# Patient Record
Sex: Male | Born: 1975
Health system: Southern US, Community
[De-identification: ages and names within clinical notes are randomized; demographics above are authoritative.]

## PROBLEM LIST (undated history)

## (undated) DIAGNOSIS — L219 Seborrheic dermatitis, unspecified: Secondary | ICD-10-CM

## (undated) DIAGNOSIS — F419 Anxiety disorder, unspecified: Secondary | ICD-10-CM

## (undated) DIAGNOSIS — K8020 Calculus of gallbladder without cholecystitis without obstruction: Secondary | ICD-10-CM

## (undated) DIAGNOSIS — K602 Anal fissure, unspecified: Secondary | ICD-10-CM

## (undated) DIAGNOSIS — K649 Unspecified hemorrhoids: Secondary | ICD-10-CM

## (undated) DIAGNOSIS — E785 Hyperlipidemia, unspecified: Secondary | ICD-10-CM

## (undated) HISTORY — DX: Anxiety disorder, unspecified: F41.9

## (undated) HISTORY — DX: Seborrheic dermatitis, unspecified: L21.9

## (undated) HISTORY — DX: Unspecified hemorrhoids: K64.9

## (undated) HISTORY — DX: Calculus of gallbladder without cholecystitis without obstruction: K80.20

## (undated) HISTORY — DX: Hyperlipidemia, unspecified: E78.5

## (undated) HISTORY — DX: Anal fissure, unspecified: K60.2

## (undated) HISTORY — PX: WISDOM TOOTH EXTRACTION: SHX21

---

## 2002-06-18 HISTORY — PX: CHOLECYSTECTOMY: SHX55

## 2003-05-02 ENCOUNTER — Emergency Department (HOSPITAL_COMMUNITY): Admission: EM | Admit: 2003-05-02 | Discharge: 2003-05-02 | Payer: Self-pay | Admitting: Emergency Medicine

## 2003-07-06 ENCOUNTER — Encounter: Admission: RE | Admit: 2003-07-06 | Discharge: 2003-08-06 | Payer: Self-pay | Admitting: Neurosurgery

## 2005-01-01 ENCOUNTER — Emergency Department (HOSPITAL_COMMUNITY): Admission: EM | Admit: 2005-01-01 | Discharge: 2005-01-01 | Payer: Self-pay | Admitting: Emergency Medicine

## 2005-08-17 ENCOUNTER — Encounter: Admission: RE | Admit: 2005-08-17 | Discharge: 2005-08-17 | Payer: Self-pay | Admitting: Emergency Medicine

## 2005-09-05 ENCOUNTER — Ambulatory Visit (HOSPITAL_COMMUNITY): Admission: RE | Admit: 2005-09-05 | Discharge: 2005-09-05 | Payer: Self-pay | Admitting: General Surgery

## 2006-08-08 IMAGING — US US ABDOMEN COMPLETE
1 series · 14 of 25 positions shown · non-contrast
Comparison: none

CLINICAL DATA: Abdominal pain particularly right upper quadrant. 
ULTRASOUND ABDOMEN COMPLETE:
TECHNIQUE: Complete abdominal ultrasound examination was performed including evaluation of the liver, gallbladder, bile ducts, pancreas, kidneys, spleen, IVC, and abdominal aorta.

[Series 1: unknown · 0.23mm/px · 14 of 72 slices shown]
[im 1/72]
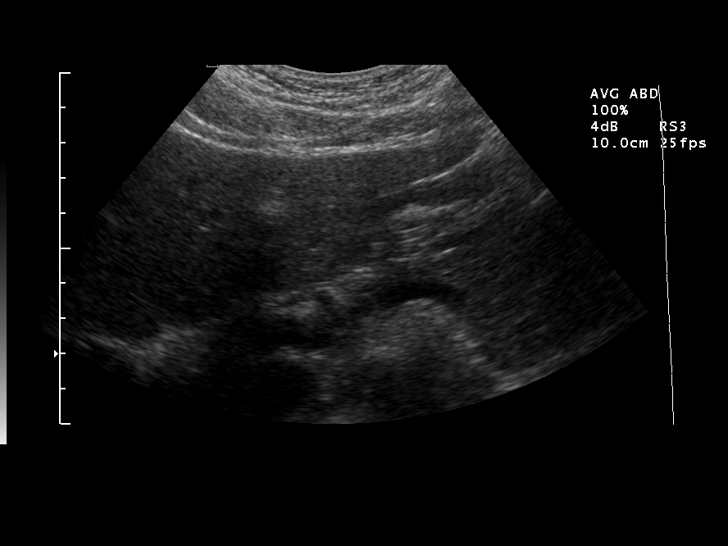
[im 6/72]
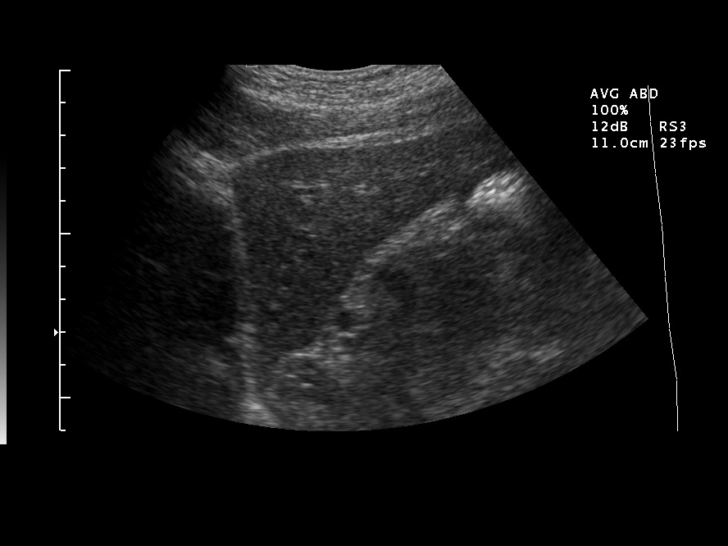
[im 12/72]
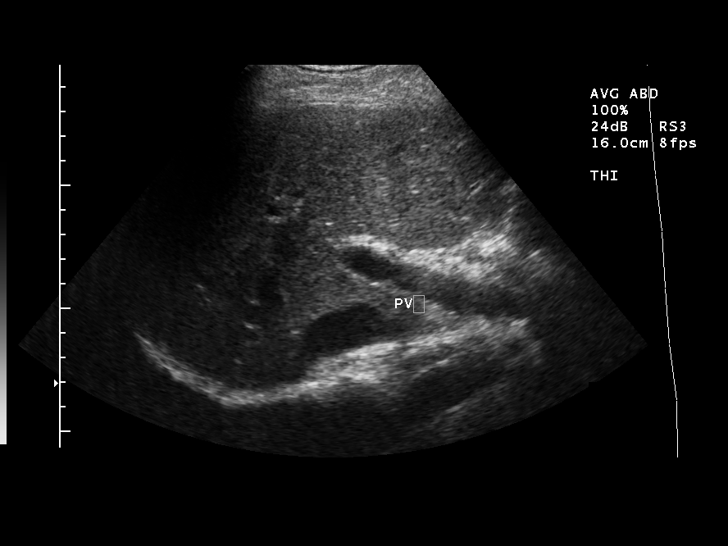
[im 18/72]
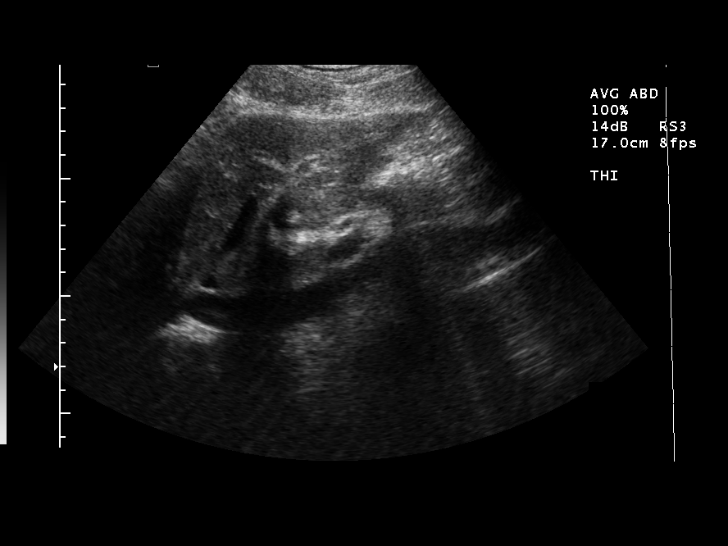
[im 24/72]
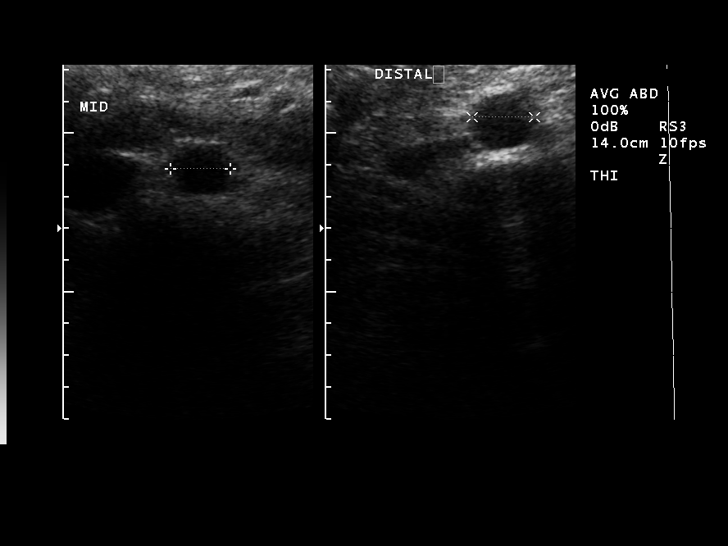
[im 27/72]
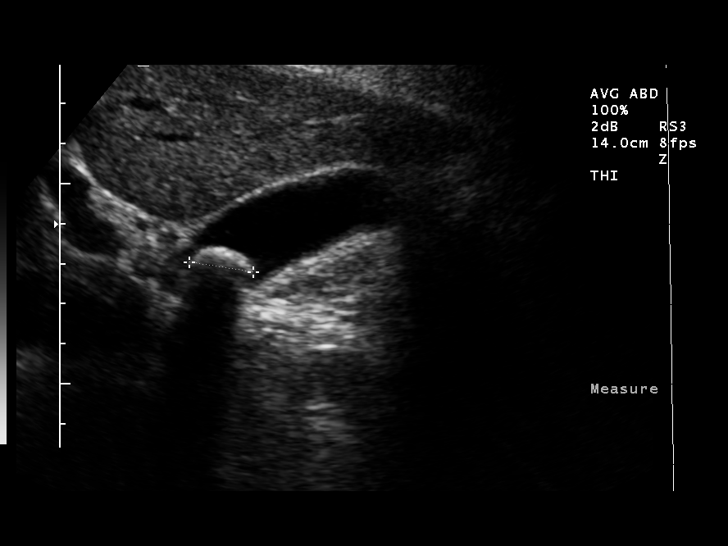
[im 33/72]
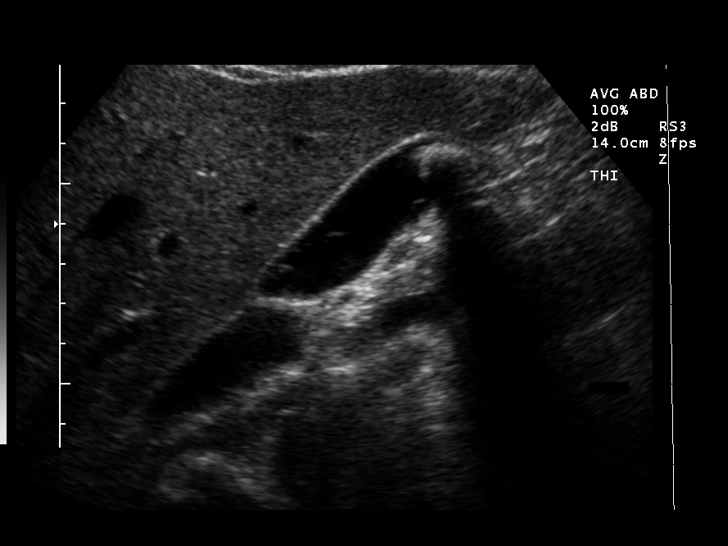
[im 39/72]
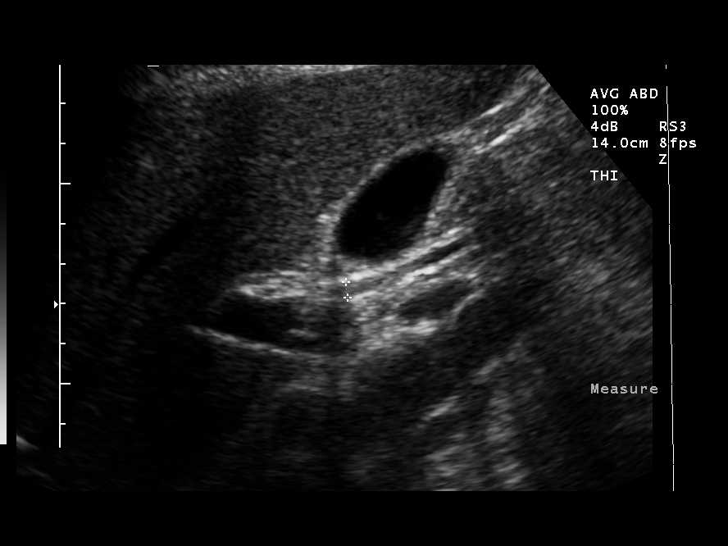
[im 45/72]
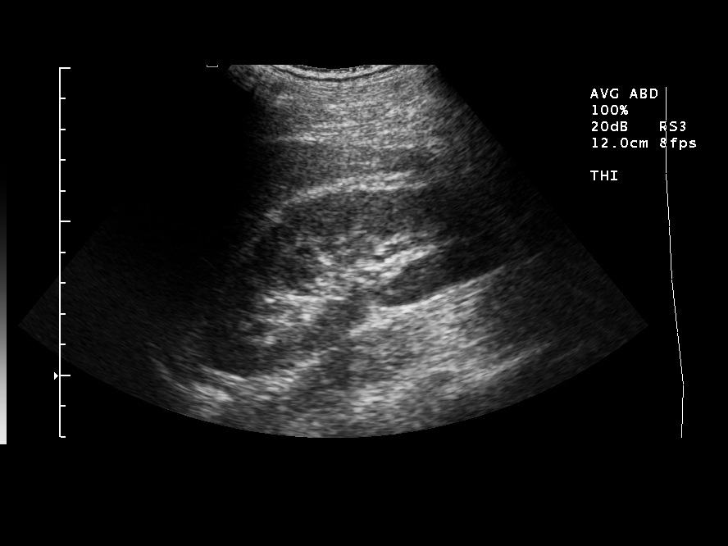
[im 48/72]
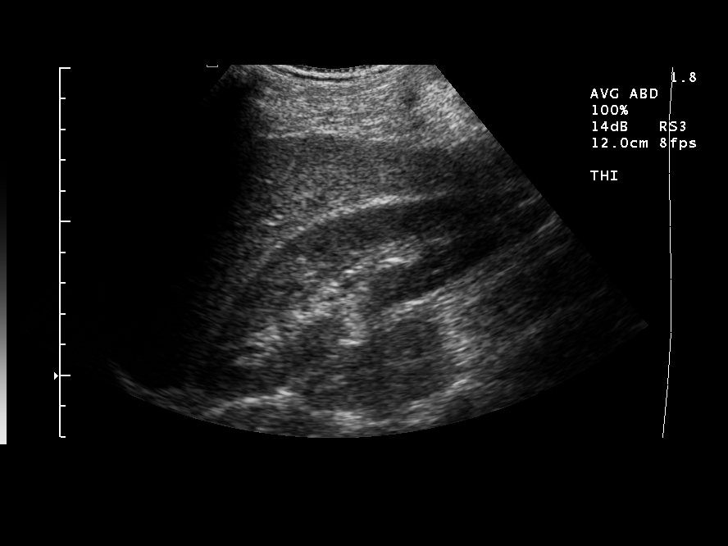
[im 54/72]
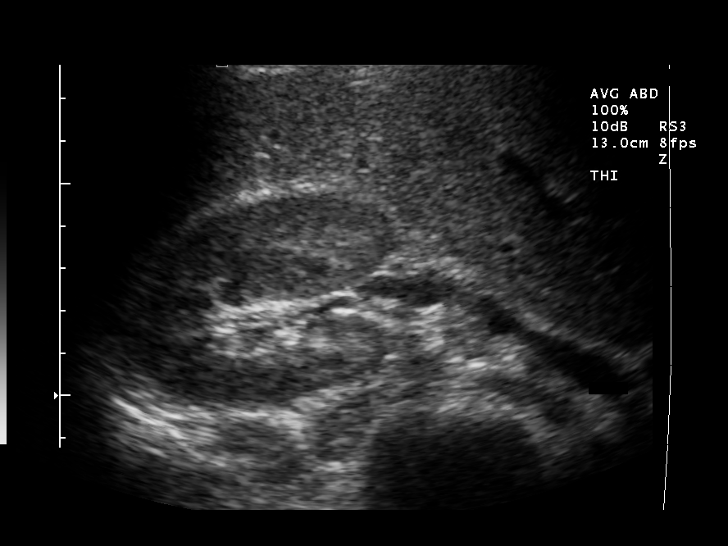
[im 60/72]
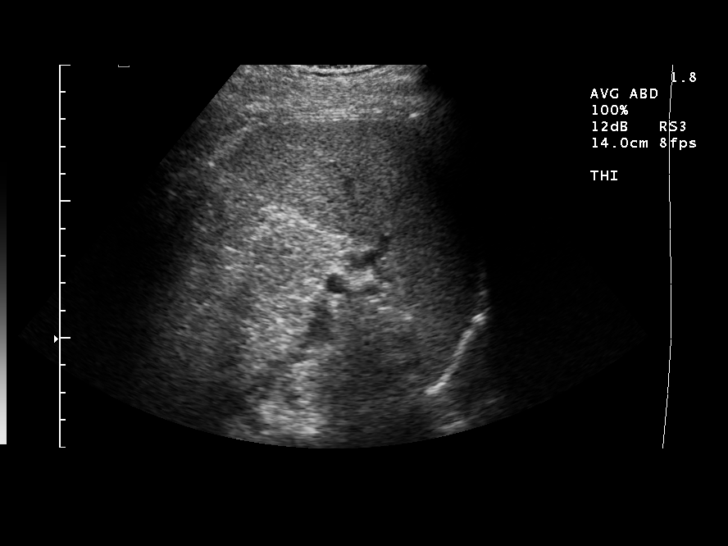
[im 66/72]
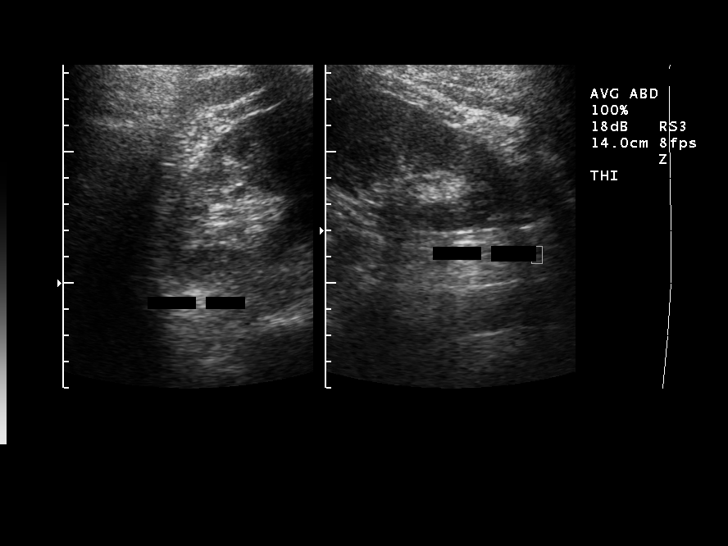
[im 72/72]
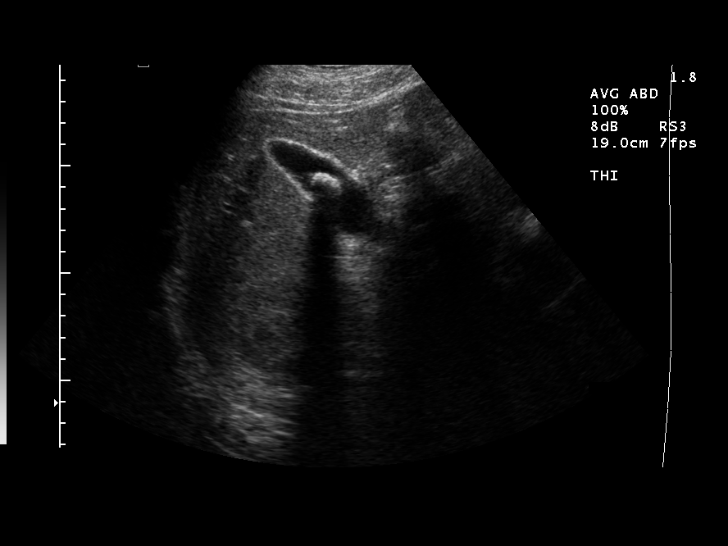

[14 of 25 positions shown; findings below may reference images not displayed]

FINDINGS: The gallbladder is well seen, and there is a mobile gallstone within the gallbladder of 1.6 cm diameter.  A small amount of sludge or tiny stone also is present.  The liver has a normal echogenic pattern.  The common bile duct is normal measuring 3.9 mm in diameter.   IVC, pancreas and spleen appear normal.  No hydronephrosis is seen.  The right kidney measures 11.3 cm sagittally with the left kidney measuring 11.9 cm.  An 8 mm cyst is noted in the lower pole of the right kidney.  The abdominal aorta is normal in caliber.
IMPRESSION: 1.6 cm mobile gallstone with probable small amount of gallbladder sludge and possible tiny gallstones.  No ductal dilatation.

## 2006-08-27 IMAGING — RF DG CHOLANGIOGRAM OPERATIVE
1 series · 4 of 4 positions shown · non-contrast
Comparison: none

CLINICAL DATA: Cholelithiasis.
 OPERATIVE CHOLANGIOGRAM:

[Series 1: run · 4 of 67 frames shown]
[frame 2/67]
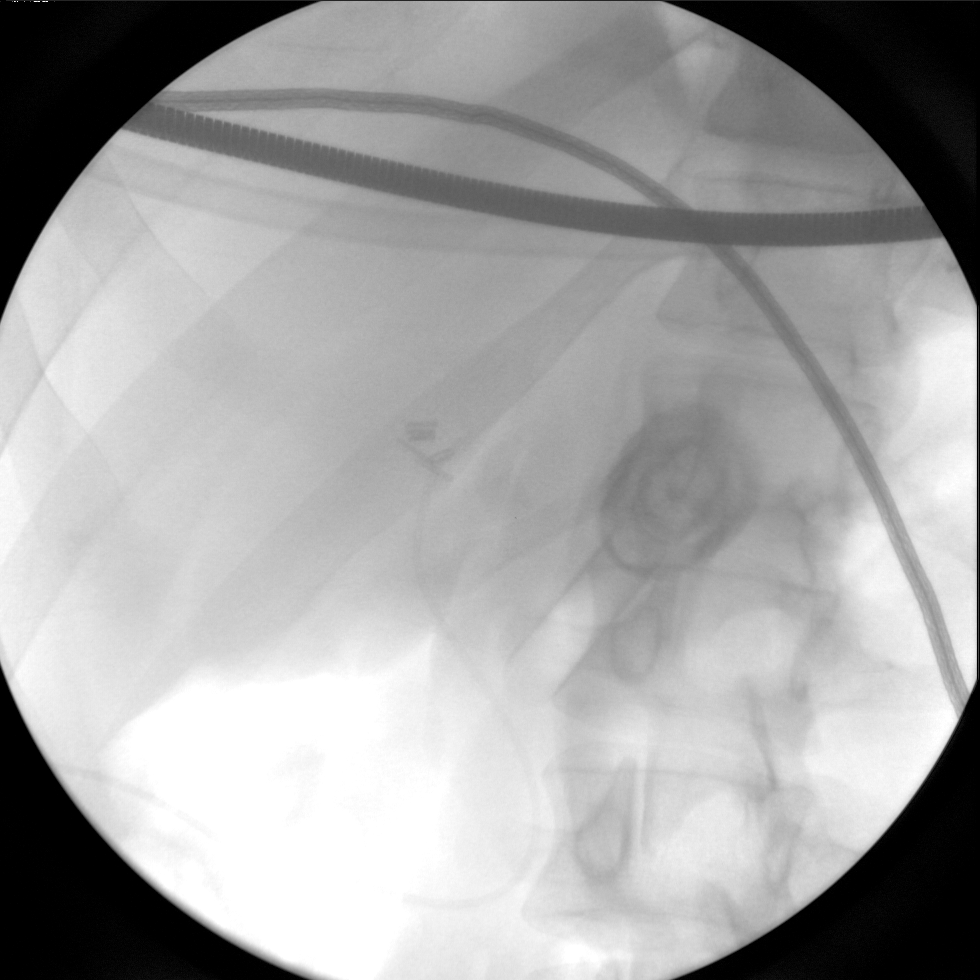
[frame 11/67]
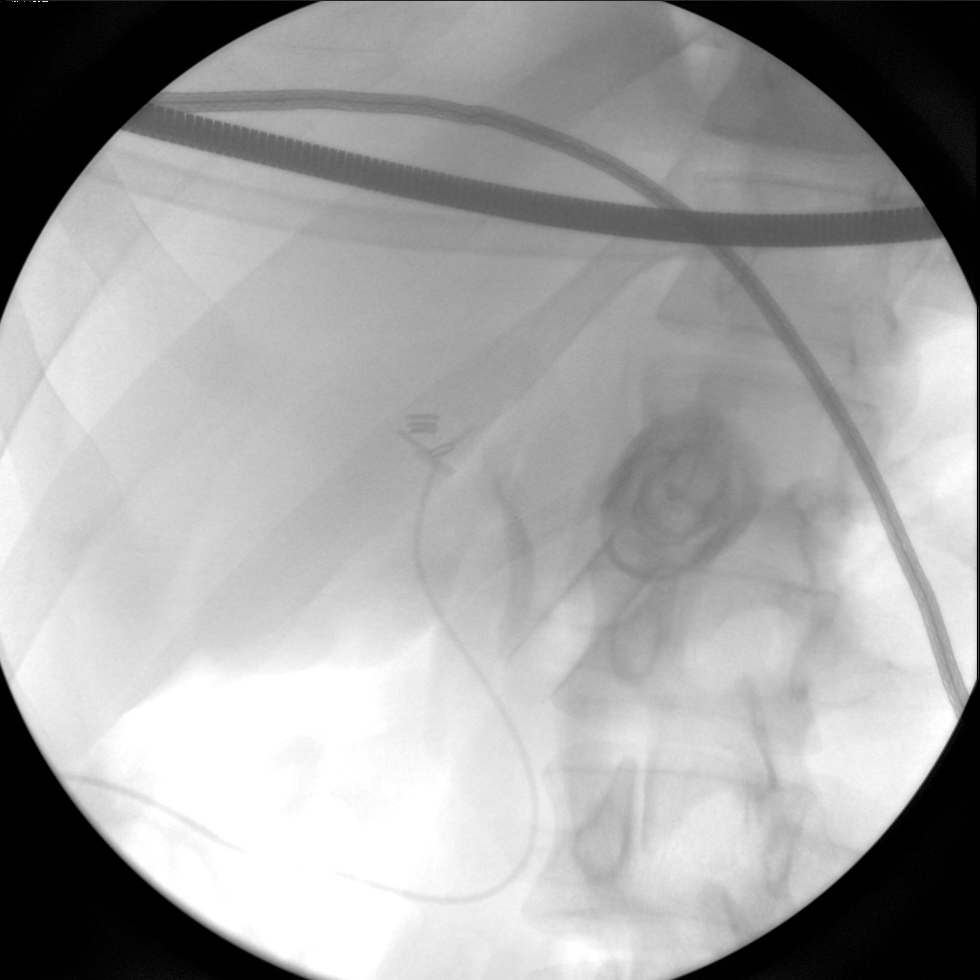
[frame 34/67]
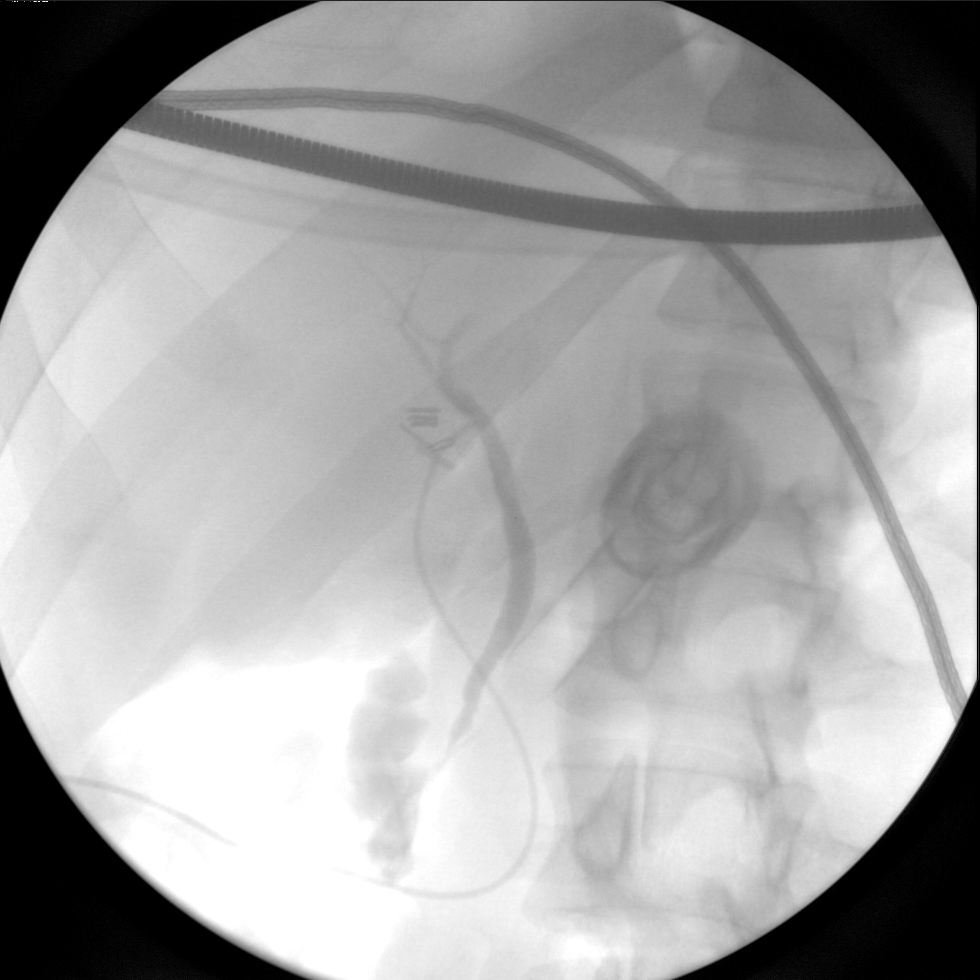
[frame 57/67]
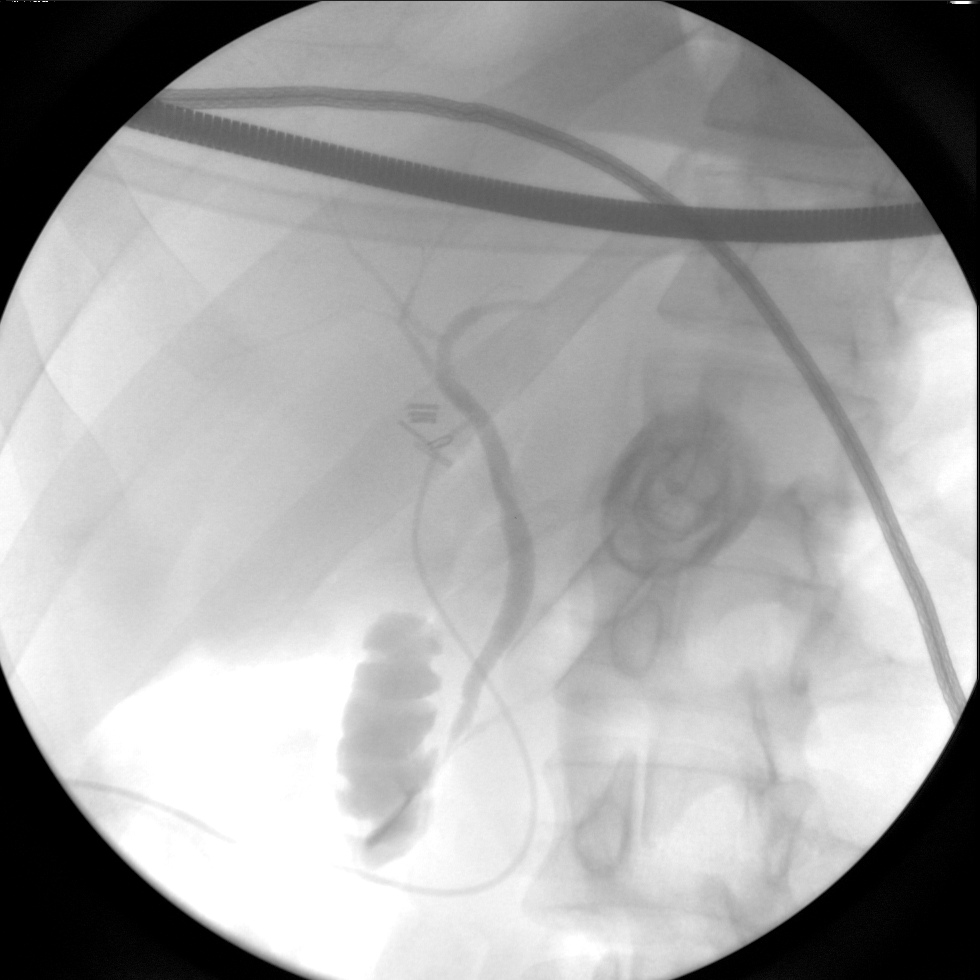

[4 of 4 positions shown; findings below may reference images not displayed]

FINDINGS: Multiple C-arm fluoroscopic exposures show contrast injection through a cannulated cystic duct remnant.  There is good filling of the common hepatic duct and common bile duct.  Contrast flows freely into the duodenum.  No filling defect or stenosis.
IMPRESSION: Normal operative cholangiogram.

## 2018-04-29 ENCOUNTER — Ambulatory Visit: Payer: Self-pay | Admitting: Family Medicine

## 2018-05-06 ENCOUNTER — Encounter: Payer: Self-pay | Admitting: Family Medicine

## 2018-05-06 ENCOUNTER — Ambulatory Visit: Payer: 59 | Admitting: Family Medicine

## 2018-05-06 VITALS — BP 130/84 | HR 66 | Temp 97.8°F | Ht 72.75 in | Wt 213.8 lb

## 2018-05-06 DIAGNOSIS — Z1159 Encounter for screening for other viral diseases: Secondary | ICD-10-CM | POA: Diagnosis not present

## 2018-05-06 DIAGNOSIS — L219 Seborrheic dermatitis, unspecified: Secondary | ICD-10-CM

## 2018-05-06 DIAGNOSIS — E785 Hyperlipidemia, unspecified: Secondary | ICD-10-CM

## 2018-05-06 DIAGNOSIS — Z13220 Encounter for screening for lipoid disorders: Secondary | ICD-10-CM

## 2018-05-06 DIAGNOSIS — Z8619 Personal history of other infectious and parasitic diseases: Secondary | ICD-10-CM | POA: Diagnosis not present

## 2018-05-06 DIAGNOSIS — M25561 Pain in right knee: Secondary | ICD-10-CM

## 2018-05-06 DIAGNOSIS — N4889 Other specified disorders of penis: Secondary | ICD-10-CM

## 2018-05-06 DIAGNOSIS — K602 Anal fissure, unspecified: Secondary | ICD-10-CM | POA: Diagnosis not present

## 2018-05-06 DIAGNOSIS — K601 Chronic anal fissure: Secondary | ICD-10-CM | POA: Insufficient documentation

## 2018-05-06 DIAGNOSIS — M25562 Pain in left knee: Secondary | ICD-10-CM

## 2018-05-06 DIAGNOSIS — Z114 Encounter for screening for human immunodeficiency virus [HIV]: Secondary | ICD-10-CM

## 2018-05-06 DIAGNOSIS — G8929 Other chronic pain: Secondary | ICD-10-CM

## 2018-05-06 LAB — COMPREHENSIVE METABOLIC PANEL
ALT: 22 U/L (ref 0–53)
AST: 20 U/L (ref 0–37)
Albumin: 4.6 g/dL (ref 3.5–5.2)
Alkaline Phosphatase: 65 U/L (ref 39–117)
BUN: 17 mg/dL (ref 6–23)
CO2: 28 mEq/L (ref 19–32)
Calcium: 9.2 mg/dL (ref 8.4–10.5)
Chloride: 103 mEq/L (ref 96–112)
Creatinine, Ser: 0.92 mg/dL (ref 0.40–1.50)
GFR: 95.98 mL/min (ref >60.00)
Glucose, Bld: 77 mg/dL (ref 70–99)
Potassium: 4.1 mEq/L (ref 3.5–5.1)
Sodium: 140 mEq/L (ref 135–145)
Total Bilirubin: 0.7 mg/dL (ref 0.2–1.2)
Total Protein: 7.1 g/dL (ref 6.0–8.3)

## 2018-05-06 LAB — LIPID PANEL
Cholesterol: 232 mg/dL — ABNORMAL HIGH (ref 0–200)
HDL: 55.1 mg/dL (ref >39.00)
LDL Cholesterol: 149 mg/dL — ABNORMAL HIGH (ref 0–99)
NonHDL: 177.36
Total CHOL/HDL Ratio: 4
Triglycerides: 142 mg/dL (ref 0.0–149.0)
VLDL: 28.4 mg/dL (ref 0.0–40.0)

## 2018-05-06 LAB — CBC
HCT: 47.8 % (ref 39.0–52.0)
Hemoglobin: 16.3 g/dL (ref 13.0–17.0)
MCHC: 34 g/dL (ref 30.0–36.0)
MCV: 86.1 fl (ref 78.0–100.0)
Platelets: 226 10*3/uL (ref 150.0–400.0)
RBC: 5.55 Mil/uL (ref 4.22–5.81)
RDW: 13 % (ref 11.5–15.5)
WBC: 4.3 10*3/uL (ref 4.0–10.5)

## 2018-05-06 MED ORDER — NITROGLYCERIN 0.4 % RE OINT: TOPICAL_OINTMENT | RECTAL | 1 refills | Status: DC

## 2018-05-06 NOTE — Assessment & Plan Note (Signed)
Continue Valtrex as needed

## 2018-05-06 NOTE — Progress Notes (Signed)
Subjective:  Connor Chapman is a 42 y.o. male who presents today with a chief complaint of anal fissure and to establish care.   HPI:  Anal Fissure Patient has had pain with defecation for several years.  This comes and goes.  He has tried several over-the-counter creams and wipes with modest improvement.  Symptoms have been stable over the last several months to years.  No obvious alleviating or aggravating factors.  No reported nausea or vomiting.  No reported abdominal pain.  No further fevers or chills.  Penile Lesion Started 2 weeks ago.  Located on instructions penis.  Nonpainful.  Symptoms seem to be improving.  No obvious precipitating events.  No other obvious alleviating or aggravating factors.  Hyperlipidemia Diet controlled.  Not currently on any medications.  Would like to have levels rechecked today.  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  Uses Valtrex as needed for outbreaks.  Seborrheic Dermatitis On ketoconazole cream as needed.  Lesions typically located on face.  Symptoms are well controlled with this.  Bilateral Knee Pain Started a few years ago.  Worse with climbing steps.  Worse with running.  Symptoms are currently manageable.   ROS: Per HPI, otherwise a complete review of systems was negative.   PMH:  The following were reviewed and entered/updated in epic: Past Medical History:  Diagnosis Date  . Anal fissure   . Dermatitis, seborrheic   . Hemorrhoids   . Hyperlipidemia    Patient Active Problem List   Diagnosis Date Noted  . Anal fissure 05/06/2018  . Penile cyst 05/06/2018  . H/O cold sores 05/06/2018  . Seborrheic dermatitis 05/06/2018  . Dyslipidemia 05/06/2018  . Chronic pain of both knees 05/06/2018   Past Surgical History:  Procedure Laterality Date  . CHOLECYSTECTOMY  2004    Family History  Problem Relation Age of Onset  . Cancer Mother        Tumor in knee  . Pancreatic cancer Paternal Grandfather   .  Hypertension Paternal Grandfather   . Cancer Paternal Grandmother     Medications- reviewed and updated Current Outpatient Medications  Medication Sig Dispense Refill  . ketoconazole (NIZORAL) 2 % cream Apply 1 application topically daily.    . valACYclovir (VALTREX) 500 MG tablet Take 500 mg by mouth 2 (two) times daily.    . Nitroglycerin 0.4 % OINT Apply twice daily for 3-4 weeks. 30 g 1   No current facility-administered medications for this visit.     Allergies-reviewed and updated Allergies  Allergen Reactions  . Penicillins Swelling    Social History   Socioeconomic History  . Marital status: Married    Spouse name: Not on file  . Number of children: Not on file  . Years of education: Not on file  . Highest education level: Not on file  Occupational History  . Not on file  Social Needs  . Financial resource strain: Not on file  . Food insecurity:    Worry: Not on file    Inability: Not on file  . Transportation needs:    Medical: Not on file    Non-medical: Not on file  Tobacco Use  . Smoking status: Former Research scientist (life sciences)  . Smokeless tobacco: Never Used  . Tobacco comment: Quit in 2002  Substance and Sexual Activity  . Alcohol use: Yes  . Drug use: Never  . Sexual activity: Yes  Lifestyle  . Physical activity:    Days per week: Not on  file    Minutes per session: Not on file  . Stress: Not on file  Relationships  . Social connections:    Talks on phone: Not on file    Gets together: Not on file    Attends religious service: Not on file    Active member of club or organization: Not on file    Attends meetings of clubs or organizations: Not on file    Relationship status: Not on file  Other Topics Concern  . Not on file  Social History Narrative  . Not on file    Objective:  Physical Exam: BP 130/84 (BP Location: Left Arm, Patient Position: Sitting, Cuff Size: Normal)   Pulse 66   Temp 97.8 F (36.6 C) (Oral)   Ht 6' 0.75" (1.848 m)   Wt 213 lb  12.8 oz (97 kg)   SpO2 99%   BMI 28.40 kg/m   Gen: NAD, resting comfortably CV: RRR with no murmurs appreciated Pulm: NWOB, CTAB with no crackles, wheezes, or rhonchi GI: Normal bowel sounds present. Soft, Nontender, Nondistended. GU: Normal external genitalia.  1 to 2 mm cystic lesion noted on shaft of penis, consistent with epidermal cyst.  Anterior midline fissure noted on perianal area. MSK: No edema, cyanosis, or clubbing noted Skin: Warm, dry Neuro: Grossly normal, moves all extremities Psych: Normal affect and thought content  Assessment/Plan:  Seborrheic dermatitis Stable.  Continue topical ketoconazole as needed.  Penile cyst Consistent with benign epidermal cyst.  Reassured patient.  Continue with watchful waiting.  H/O cold sores Continue Valtrex as needed.  Dyslipidemia Check lipid panel today.  Chronic pain of both knees Likely secondary to osteoarthritis.  Recommended conservative measures including over-the-counter analgesics, ice, and compression.  Anal fissure No red flags.  Start topical nitroglycerin ointment twice daily for the next 3 to 4 weeks.  Recommended fiber supplement, stool softener, and good oral hydration.  Consider surgical referral if no improvement.  Preventive healthcare Check hep C and HIV antibody.  [name]. Jerline Pain, MD 05/06/2018 9:33 AM

## 2018-05-06 NOTE — Assessment & Plan Note (Signed)
Consistent with benign epidermal cyst.  Reassured patient.  Continue with watchful waiting.

## 2018-05-06 NOTE — Patient Instructions (Signed)
It was very nice to see you today!  Use the nitroglycerin ointment twice daily for the next 3 to 4 weeks.  Please let me know if your symptoms worsen or do not improve with this.  Ensure you are getting plenty of fiber and water in your diet.  We will check blood work today.  Please come back to see me in 1 year, or sooner as needed.  Take care, Dr Debroah Loop Fissure, Adult An anal fissure is a small tear or crack in the skin around the anus. Bleeding from a fissure usually stops on its own within a few minutes. However, bleeding will often occur again with each bowel movement until the crack heals. What are the causes? This condition may be caused by:  Passing large, hard stool (feces).  Frequent diarrhea.  Constipation.  Inflammatory bowel disease (Crohn disease or ulcerative colitis).  Infections.  Anal sex.  What are the signs or symptoms? Symptoms of this condition include:  Bleeding from the rectum.  Small amounts of blood seen on your stool, on toilet paper, or in the toilet after a bowel movement.  Painful bowel movements.  Itching or irritation around the anus.  How is this diagnosed? A health care provider may diagnose this condition by closely examining the anal area. An anal fissure can usually be seen with careful inspection. In some cases, a rectal exam may be performed, or a short tube (anoscope) may be used to examine the anal canal. How is this treated? Treatment for this condition may include:  Taking steps to avoid constipation. This may include making changes to your diet, such as increasing your intake of fiber or fluid.  Taking fiber supplements. These supplements can soften your stool to help make bowel movements easier. Your health care provider may also prescribe a stool softener if your stool is often hard.  Taking sitz baths. This may help to heal the tear.  Using medicated creams or ointments. These may be prescribed to lessen  discomfort.  Follow these instructions at home: Eating and drinking  Avoid foods that may be constipating, such as bananas and dairy products.  Drink enough fluid to keep your urine clear or pale yellow.  Maintain a diet that is high in fruits, whole grains, and vegetables. General instructions  Keep the anal area as clean and dry as possible.  Take sitz baths as told by your health care provider. Do not use soap in the sitz baths.  Take over-the-counter and prescription medicines only as told by your health care provider.  Use creams or ointments only as told by your health care provider.  Keep all follow-up visits as told by your health care provider. This is important. Contact a health care provider if:  You have more bleeding.  You have a fever.  You have diarrhea that is mixed with blood.  You continue to have pain.  Your problem is getting worse rather than better. This information is not intended to replace advice given to you by your health care provider. Make sure you discuss any questions you have with your health care provider. Document Released: 06/04/2005 Document Revised: 10/12/2015 Document Reviewed: 08/30/2014 Elsevier Interactive Patient Education  [employer].

## 2018-05-06 NOTE — Assessment & Plan Note (Signed)
Likely secondary to osteoarthritis.  Recommended conservative measures including over-the-counter analgesics, ice, and compression.

## 2018-05-06 NOTE — Assessment & Plan Note (Signed)
No red flags.  Start topical nitroglycerin ointment twice daily for the next 3 to 4 weeks.  Recommended fiber supplement, stool softener, and good oral hydration.  Consider surgical referral if no improvement.

## 2018-05-06 NOTE — Assessment & Plan Note (Signed)
Check lipid panel today 

## 2018-05-06 NOTE — Assessment & Plan Note (Signed)
Stable.  Continue topical ketoconazole as needed.

## 2018-05-07 LAB — HIV ANTIBODY (ROUTINE TESTING W REFLEX): HIV 1&2 Ab, 4th Generation: NONREACTIVE

## 2018-05-07 LAB — HEPATITIS C ANTIBODY
Hepatitis C Ab: NONREACTIVE
SIGNAL TO CUT-OFF: 0.01 (ref <1.00)

## 2018-05-08 NOTE — Progress Notes (Signed)
Please inform patient of the following:  His cholesterol was slightly elevated, but the rest of his blood work was all normal. Do not need to start medications at this point, but he should continue working on a healthy diet and regular exercise and we can recheck next year.  Connor Chapman. Jerline Pain, MD 05/08/2018 12:45 PM

## 2019-02-11 ENCOUNTER — Encounter: Payer: Self-pay | Admitting: Family Medicine

## 2019-02-11 ENCOUNTER — Ambulatory Visit (INDEPENDENT_AMBULATORY_CARE_PROVIDER_SITE_OTHER): Payer: 59 | Admitting: Family Medicine

## 2019-02-11 DIAGNOSIS — J329 Chronic sinusitis, unspecified: Secondary | ICD-10-CM | POA: Diagnosis not present

## 2019-02-11 DIAGNOSIS — H9201 Otalgia, right ear: Secondary | ICD-10-CM | POA: Diagnosis not present

## 2019-02-11 MED ORDER — PREDNISONE 50 MG PO TABS: ORAL_TABLET | ORAL | 0 refills | Status: DC

## 2019-02-11 MED ORDER — DOXYCYCLINE HYCLATE 100 MG PO TABS: 100.0000 mg | ORAL_TABLET | Freq: Two times a day (BID) | ORAL | 0 refills | Status: DC

## 2019-02-11 MED ORDER — AZELASTINE HCL 0.1 % NA SOLN: 2.0000 | Freq: Two times a day (BID) | NASAL | 12 refills | Status: DC

## 2019-02-11 NOTE — Progress Notes (Signed)
    Chief Complaint:  Connor Chapman is a 43 y.o. male who presents today for a virtual office visit with a chief complaint of ear Chapman.   Assessment/Plan:  Ear Chapman / Sinusitis Discussed limitations of virtual evaluation and that I cannot diagnose ear infections via the phone.  Likely eustachian tube dysfunction secondary to allergic rhinitis/sinusitis.  Will start Astelin nasal spray and prednisone.  Also send in a "pocket prescription" for doxycycline with strict instruction not start unless symptoms worsen or do not improve over the next few days.  Discussed reasons to return to care.  Follow-up as needed.    Subjective:  HPI:  Ear Chapman Started about 3 days ago. Located in right ear. Associated with some sinus pressure as well.  Some nasal drainage.  No fever chills.  Tried taking over-the-counter sinus congestion medication with modest improvement.  No other treatments tried.  No known sick contacts.  No other obvious alleviating or aggravating factors.  ROS: Per HPI  PMH: He reports that he has quit smoking. He has never used smokeless tobacco. He reports current alcohol use. He reports that he does not use drugs.      Objective/Observations  Physical Exam: Gen: NAD, resting comfortably Pulm: Normal work of breathing Neuro: Grossly normal, moves all extremities Psych: Normal affect and thought content  Virtual Visit via Video   I connected with Connor Chapman on 02/11/19 at 11:20 AM EDT by a video enabled telemedicine application and verified that I am speaking with the correct person using two identifiers. I discussed the limitations of evaluation and management by telemedicine and the availability of in person appointments. The patient expressed understanding and agreed to proceed.   Patient location: Home Provider location: [place] participating in the virtual visit: Myself and Patient     Connor Chapman. Connor Pain, MD 02/11/2019 10:22 AM

## 2019-03-04 ENCOUNTER — Other Ambulatory Visit: Payer: Self-pay

## 2019-03-04 ENCOUNTER — Encounter: Payer: Self-pay | Admitting: Physician Assistant

## 2019-03-04 ENCOUNTER — Ambulatory Visit: Payer: 59 | Admitting: Physician Assistant

## 2019-03-04 VITALS — BP 110/70 | HR 67 | Temp 98.1°F | Ht 72.75 in | Wt 216.4 lb

## 2019-03-04 DIAGNOSIS — H6690 Otitis media, unspecified, unspecified ear: Secondary | ICD-10-CM

## 2019-03-04 MED ORDER — FLUTICASONE PROPIONATE 50 MCG/ACT NA SUSP
2.0000 | Freq: Every day | NASAL | 2 refills | Status: DC
Start: 2019-03-04 — End: 2019-09-29

## 2019-03-04 MED ORDER — AZITHROMYCIN 250 MG PO TABS: ORAL_TABLET | ORAL | 0 refills | Status: DC

## 2019-03-04 NOTE — Patient Instructions (Addendum)
It was great to see you!  Start oral azithromycin.  Please start flonase.  May use ibuprofen for swelling/inflammation.  Try to always clean anything that you put in your ear (such as headphones)  Push fluids and get plenty of rest. Please return if you are not improving as expected, or if you have high fevers (>101.5) or difficulty swallowing or worsening productive cough.  Call clinic with questions.  I hope you start feeling better soon!

## 2019-03-04 NOTE — Progress Notes (Signed)
Connor Chapman is a 43 y.o. male here for a new problem.  I acted as a Education administrator for [employer], PA-C Anselmo Pickler, LPN  History of Present Illness:   Chief Complaint  Patient presents with  . Otalgia    HPI   Otalgia Pt c/o Rt ear pain x 3 weeks. Pt had virtual visit on 8/26 was prescribed antibiotic Doxy, Astelin Nasal spray and Prednisone. Pt said pain went away but has not cleared up. Tingling around ear and jaw line. He is having yellow drainage from ear off and on. Denies fever, chills, nasal discharge, cough.  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    Past Medical History:  Diagnosis Date  . Anal fissure   . Dermatitis, seborrheic   . Hemorrhoids   . Hyperlipidemia      Social History   Socioeconomic History  . Marital status: Married    Spouse name: Not on file  . Number of children: Not on file  . Years of education: Not on file  . Highest education level: Not on file  Occupational History  . Not on file  Social Needs  . Financial resource strain: Not on file  . Food insecurity    Worry: Not on file    Inability: Not on file  . Transportation needs    Medical: Not on file    Non-medical: Not on file  Tobacco Use  . Smoking status: Former Research scientist (life sciences)  . Smokeless tobacco: Never Used  . Tobacco comment: Quit in 2002  Substance and Sexual Activity  . Alcohol use: Yes  . Drug use: Never  . Sexual activity: Yes  Lifestyle  . Physical activity    Days per week: Not on file    Minutes per session: Not on file  . Stress: Not on file  Relationships  . Social Herbalist on phone: Not on file    Gets together: Not on file    Attends religious service: Not on file    Active member of club or organization: Not on file    Attends meetings of clubs or organizations: Not on file    Relationship status: Not on file  . Intimate partner violence    Fear of current or ex partner: Not on file     Emotionally abused: Not on file    Physically abused: Not on file    Forced sexual activity: Not on file  Other Topics Concern  . Not on file  Social History Narrative  . Not on file    Past Surgical History:  Procedure Laterality Date  . CHOLECYSTECTOMY  2004    Family History  Problem Relation Age of Onset  . Cancer Mother        Tumor in knee  . Pancreatic cancer Paternal Grandfather   . Hypertension Paternal Grandfather   . Cancer Paternal Grandmother     Allergies  Allergen Reactions  . Penicillins Swelling    Current Medications:   Current Outpatient Medications:  .  azelastine (ASTELIN) 0.1 % nasal spray, Place 2 sprays into both nostrils 2 (two) times daily., Disp: 30 mL, Rfl: 12 .  valACYclovir (VALTREX) 500 MG tablet, Take 500 mg by mouth as needed. , Disp: , Rfl:  .  azithromycin (ZITHROMAX Z-PAK) 250 MG tablet, Take 2 tablets ( total of 500 mg) PO on day 1, then 1 tablet ( total of 250 mg)  PO q24 x 4 days., Disp: 6 each, Rfl: 0 .  fluticasone (FLONASE) 50 MCG/ACT nasal spray, Place 2 sprays into both nostrils daily., Disp: 16 g, Rfl: 2   Review of Systems:   ROS  Negative unless otherwise specified per HPI.   Vitals:   Vitals:   03/04/19 1356  BP: 110/70  Pulse: 67  Temp: 98.1 F (36.7 C)  TempSrc: Temporal  SpO2: 97%  Weight: 216 lb 6.1 oz (98.1 kg)  Height: 6' 0.75" (1.848 m)     Body mass index is 28.74 kg/m.  Physical Exam:   Physical Exam Vitals signs and nursing note reviewed.  Constitutional:      General: He is not in acute distress.    Appearance: He is well-developed. He is not ill-appearing or toxic-appearing.  HENT:     Head: Normocephalic and atraumatic.     Right Ear: External ear normal. A middle ear effusion is present. Tympanic membrane is not erythematous, retracted or bulging.     Left Ear: Tympanic membrane, ear canal and external ear normal. Tympanic membrane is not erythematous, retracted or bulging.     Ears:      Comments: R canal with slight yellow discharge, does appear to have a middle ear effusion with erythema and moderate amount of yellow appearing fluid behind TM     Nose: Nose normal.     Right Sinus: No maxillary sinus tenderness or frontal sinus tenderness.     Left Sinus: No maxillary sinus tenderness or frontal sinus tenderness.     Mouth/Throat:     Pharynx: Uvula midline. No posterior oropharyngeal erythema.  Eyes:     General: Lids are normal.     Conjunctiva/sclera: Conjunctivae normal.  Neck:     Trachea: Trachea normal.  Cardiovascular:     Rate and Rhythm: Normal rate and regular rhythm.     Pulses: Normal pulses.     Heart sounds: Normal heart sounds, S1 normal and S2 normal.     Comments: No LE edema Pulmonary:     Effort: Pulmonary effort is normal.     Breath sounds: Normal breath sounds. No decreased breath sounds, wheezing, rhonchi or rales.  Lymphadenopathy:     Cervical: No cervical adenopathy.  Skin:    General: Skin is warm and dry.  Neurological:     Mental Status: He is alert.     GCS: GCS eye subscore is 4. GCS verbal subscore is 5. GCS motor subscore is 6.  Psychiatric:        Speech: Speech normal.        Behavior: Behavior normal. Behavior is cooperative.      Assessment and Plan:   [name] was seen today for otalgia.  Diagnoses and all orders for this visit:  Acute otitis media, unspecified otitis media type  Other orders -     azithromycin (ZITHROMAX Z-PAK) 250 MG tablet; Take 2 tablets ( total of 500 mg) PO on day 1, then 1 tablet ( total of 250 mg) PO q24 x 4 days. -     fluticasone (FLONASE) 50 MCG/ACT nasal spray; Place 2 sprays into both nostrils daily.   I do feel like this is an unresolved ear infection, will trial azithromycin.  He is PCN allergic.  I also recommended that he trial Flonase in bilateral nostrils daily.  I also had a discussion with him about cleaning the ear pieces when he is using them, I do think that this may be  contributing  to some possible inflammation in his ear canal.  Worsening precautions advised, if no improvement probably need to consider ENT.  Marland Kitchen Reviewed expectations re: course of current medical issues. . Discussed self-management of symptoms. . Outlined signs and symptoms indicating need for more acute intervention. . Patient verbalized understanding and all questions were answered. . See orders for this visit as documented in the electronic medical record. . Patient received an After-Visit Summary.  CMA or LPN served as scribe during this visit. History, Physical, and Plan performed by medical provider. The above documentation has been reviewed and is accurate and complete.   Inda Coke, PA-C

## 2019-08-29 ENCOUNTER — Ambulatory Visit: Payer: 59 | Attending: Internal Medicine

## 2019-08-29 DIAGNOSIS — Z23 Encounter for immunization: Secondary | ICD-10-CM

## 2019-08-29 NOTE — Progress Notes (Signed)
   Covid-19 Vaccination Clinic  Name:  Connor Chapman    MRN: [id number] DOB: 1975/11/03  08/29/2019  Mr. Swatek was observed post Covid-19 immunization for 30 minutes based on pre-vaccination screening without incident. He was provided with Vaccine Information Sheet and instruction to access the V-Safe system.   Mr. Cima was instructed to call 911 with any severe reactions post vaccine: Marland Kitchen Difficulty breathing  . Swelling of face and throat  . A fast heartbeat  . A bad rash all over body  . Dizziness and weakness   Immunizations Administered    Name Date Dose VIS Date Route   Pfizer COVID-19 Vaccine 08/29/2019  3:43 PM 0.3 mL 05/29/2019 Intramuscular   Manufacturer: [place]   Lot: [id number]   [place]: [id number]

## 2019-09-01 ENCOUNTER — Telehealth: Payer: Self-pay

## 2019-09-01 NOTE — Telephone Encounter (Signed)
Patient would like to come in a few days before his scheduled physical to have labs done. Next appt 09/21/19

## 2019-09-01 NOTE — Telephone Encounter (Signed)
We are not doing labs before appt due to insurance is not covering labs

## 2019-09-22 ENCOUNTER — Ambulatory Visit: Payer: 59

## 2019-09-22 ENCOUNTER — Ambulatory Visit: Payer: 59 | Attending: Internal Medicine

## 2019-09-22 DIAGNOSIS — Z23 Encounter for immunization: Secondary | ICD-10-CM

## 2019-09-22 NOTE — Progress Notes (Signed)
   Covid-19 Vaccination Clinic  Name:  Connor Chapman    MRN: [id number] DOB: [birth year]  09/22/2019  Mr. Connor Chapman was observed post Covid-19 immunization for 30 minutes based on pre-vaccination screening without incident. He was provided with Vaccine Information Sheet and instruction to access the V-Safe system.   Mr. Connor Chapman was instructed to call 911 with any severe reactions post vaccine: Marland Kitchen Difficulty breathing  . Swelling of face and throat  . A fast heartbeat  . A bad rash all over body  . Dizziness and weakness   Immunizations Administered    Name Date Dose VIS Date Route   Pfizer COVID-19 Vaccine 09/22/2019  1:13 PM 0.3 mL 05/29/2019 Intramuscular   Manufacturer: [place]   Lot: [id number]   [place]: [id number]

## 2019-09-29 ENCOUNTER — Ambulatory Visit (INDEPENDENT_AMBULATORY_CARE_PROVIDER_SITE_OTHER): Payer: 59 | Admitting: Family Medicine

## 2019-09-29 ENCOUNTER — Encounter: Payer: Self-pay | Admitting: Family Medicine

## 2019-09-29 ENCOUNTER — Other Ambulatory Visit: Payer: Self-pay

## 2019-09-29 VITALS — BP 110/70 | HR 67 | Temp 98.1°F | Ht 72.25 in | Wt 220.0 lb

## 2019-09-29 DIAGNOSIS — E663 Overweight: Secondary | ICD-10-CM

## 2019-09-29 DIAGNOSIS — K602 Anal fissure, unspecified: Secondary | ICD-10-CM | POA: Diagnosis not present

## 2019-09-29 DIAGNOSIS — E785 Hyperlipidemia, unspecified: Secondary | ICD-10-CM

## 2019-09-29 DIAGNOSIS — Z6829 Body mass index (BMI) 29.0-29.9, adult: Secondary | ICD-10-CM

## 2019-09-29 DIAGNOSIS — Z0001 Encounter for general adult medical examination with abnormal findings: Secondary | ICD-10-CM | POA: Diagnosis not present

## 2019-09-29 LAB — COMPREHENSIVE METABOLIC PANEL
ALT: 14 U/L (ref 0–53)
AST: 18 U/L (ref 0–37)
Albumin: 4.4 g/dL (ref 3.5–5.2)
Alkaline Phosphatase: 70 U/L (ref 39–117)
BUN: 17 mg/dL (ref 6–23)
CO2: 27 mEq/L (ref 19–32)
Calcium: 8.9 mg/dL (ref 8.4–10.5)
Chloride: 104 mEq/L (ref 96–112)
Creatinine, Ser: 0.92 mg/dL (ref 0.40–1.50)
GFR: 89.7 mL/min (ref >60.00)
Glucose, Bld: 90 mg/dL (ref 70–99)
Potassium: 4.4 mEq/L (ref 3.5–5.1)
Sodium: 137 mEq/L (ref 135–145)
Total Bilirubin: 0.6 mg/dL (ref 0.2–1.2)
Total Protein: 6.6 g/dL (ref 6.0–8.3)

## 2019-09-29 LAB — LIPID PANEL
Cholesterol: 222 mg/dL — ABNORMAL HIGH (ref 0–200)
HDL: 51.4 mg/dL (ref >39.00)
LDL Cholesterol: 154 mg/dL — ABNORMAL HIGH (ref 0–99)
NonHDL: 170.53
Total CHOL/HDL Ratio: 4
Triglycerides: 83 mg/dL (ref 0.0–149.0)
VLDL: 16.6 mg/dL (ref 0.0–40.0)

## 2019-09-29 LAB — TSH: TSH: 1.41 u[IU]/mL (ref 0.35–4.50)

## 2019-09-29 LAB — CBC
HCT: 44.1 % (ref 39.0–52.0)
Hemoglobin: 15 g/dL (ref 13.0–17.0)
MCHC: 34.1 g/dL (ref 30.0–36.0)
MCV: 86.7 fl (ref 78.0–100.0)
Platelets: 196 10*3/uL (ref 150.0–400.0)
RBC: 5.08 Mil/uL (ref 4.22–5.81)
RDW: 12.8 % (ref 11.5–15.5)
WBC: 4.8 10*3/uL (ref 4.0–10.5)

## 2019-09-29 NOTE — Assessment & Plan Note (Addendum)
Check lipid panel, CBC, CMET, TSH.  Discussed lifestyle modifications.

## 2019-09-29 NOTE — Progress Notes (Signed)
Chief Complaint:  Connor Chapman is a 44 y.o. male who presents today for his annual comprehensive physical exam.    Assessment/Plan:  Chronic Problems Addressed Today: Dyslipidemia Check lipid panel, CBC, CMET, TSH.  Discussed lifestyle modifications.  Anal fissure Recommended he mix topical nitroglycerin with 18 L void side effects.  We will also place referral to surgery for request.  Discussed importance of good oral hydration and fiber intake.   Body mass index is 29.63 kg/m. / Overweight BMI Metric Follow Up - 09/29/19 1005      BMI Metric Follow Up-Please document annually   BMI Metric Follow Up  Education provided       Preventative Healthcare: Check CBC, C met, TSH, lipid panel.  Patient Counseling(The following topics were reviewed and/or handout was given):  -Nutrition: Stressed importance of moderation in sodium/caffeine intake, saturated fat and cholesterol, caloric balance, sufficient intake of fresh fruits, vegetables, and fiber.  -Stressed the importance of regular exercise.   -Substance Abuse: Discussed cessation/primary prevention of tobacco, alcohol, or other drug use; driving or other dangerous activities under the influence; availability of treatment for abuse.   -Injury prevention: Discussed safety belts, safety helmets, smoke detector, smoking near bedding or upholstery.   -Sexuality: Discussed sexually transmitted diseases, partner selection, use of condoms, avoidance of unintended pregnancy and contraceptive alternatives.   -Dental health: Discussed importance of regular tooth brushing, flossing, and dental visits.  -Health maintenance and immunizations reviewed. Please refer to Health maintenance section.  Return to care in 1 year for next preventative visit.     Subjective:  HPI:  He has no acute complaints today.   Lifestyle Diet: Balanced. Plenty of fruits and vegetables.  Exercise: Weight lifting and cardio.   Depression screen PHQ 2/9  05/06/2018  Decreased Interest 0  Down, Depressed, Hopeless 0  PHQ - 2 Score 0   There are no preventive care reminders to display for this patient.   ROS: Per HPI, otherwise a complete review of systems was negative.   PMH:  The following were reviewed and entered/updated in epic: Past Medical History:  Diagnosis Date  . Anal fissure   . Dermatitis, seborrheic   . Hemorrhoids   . Hyperlipidemia    Patient Active Problem List   Diagnosis Date Noted  . Anal fissure 05/06/2018  . Penile cyst 05/06/2018  . H/O cold sores 05/06/2018  . Seborrheic dermatitis 05/06/2018  . Dyslipidemia 05/06/2018  . Chronic pain of both knees 05/06/2018   Past Surgical History:  Procedure Laterality Date  . CHOLECYSTECTOMY  2004    Family History  Problem Relation Age of Onset  . Cancer Mother        Tumor in knee  . Pancreatic cancer Paternal Grandfather   . Hypertension Paternal Grandfather   . Cancer Paternal Grandmother     Medications- reviewed and updated Current Outpatient Medications  Medication Sig Dispense Refill  . valACYclovir (VALTREX) 500 MG tablet Take 500 mg by mouth as needed.      No current facility-administered medications for this visit.    Allergies-reviewed and updated Allergies  Allergen Reactions  . Penicillins Swelling    Social History   Socioeconomic History  . Marital status: Married    Spouse name: Not on file  . Number of children: Not on file  . Years of education: Not on file  . Highest education level: Not on file  Occupational History  . Not on file  Tobacco Use  . Smoking status:  Former Smoker  . Smokeless tobacco: Never Used  . Tobacco comment: Quit in 2002  Substance and Sexual Activity  . Alcohol use: Yes  . Drug use: Never  . Sexual activity: Yes  Other Topics Concern  . Not on file  Social History Narrative  . Not on file   Social Determinants of Health   Financial Resource Strain:   . Difficulty of Paying Living  Expenses:   Food Insecurity:   . Worried About Charity fundraiser in the Last Year:   . Arboriculturist in the Last Year:   Transportation Needs:   . Film/video editor (Medical):   Marland Kitchen Lack of Transportation (Non-Medical):   Physical Activity:   . Days of Exercise per Week:   . Minutes of Exercise per Session:   Stress:   . Feeling of Stress :   Social Connections:   . Frequency of Communication with Friends and Family:   . Frequency of Social Gatherings with Friends and Family:   . Attends Religious Services:   . Active Member of Clubs or Organizations:   . Attends Archivist Meetings:   Marland Kitchen Marital Status:         Objective:  Physical Exam: BP 110/70 (BP Location: Left Arm, Patient Position: Sitting, Cuff Size: Large)   Pulse 67   Temp 98.1 F (36.7 C) (Temporal)   Ht 6' 0.25" (1.835 m)   Wt 220 lb (99.8 kg)   SpO2 98%   BMI 29.63 kg/m   Body mass index is 29.63 kg/m. Wt Readings from Last 3 Encounters:  09/29/19 220 lb (99.8 kg)  03/04/19 216 lb 6.1 oz (98.1 kg)  05/06/18 213 lb 12.8 oz (97 kg)   Gen: NAD, resting comfortably HEENT: TMs normal bilaterally. OP clear. No thyromegaly noted.  CV: RRR with no murmurs appreciated Pulm: NWOB, CTAB with no crackles, wheezes, or rhonchi GI: Normal bowel sounds present. Soft, Nontender, Nondistended. MSK: no edema, cyanosis, or clubbing noted Skin: warm, dry Neuro: CN2-12 grossly intact. Strength 5/5 in upper and lower extremities. Reflexes symmetric and intact bilaterally.  Psych: Normal affect and thought content     Cerinity Zynda M. Jerline Pain, MD 09/29/2019 10:05 AM

## 2019-09-29 NOTE — Assessment & Plan Note (Signed)
Recommended he mix topical nitroglycerin with 18 L void side effects.  We will also place referral to surgery for request.  Discussed importance of good oral hydration and fiber intake.

## 2019-09-29 NOTE — Patient Instructions (Signed)
It was very nice to see you today!  We will check labs today.  Please try mixing lipid nitroglycerin ointment with Desitin or A&E ointment.  Also place a referral for you to see the surgeon.  We will see back in year for your next physical with blood work, or sooner if needed.  Take care, Dr Jerline Pain  Please try these tips to maintain a healthy lifestyle:   Eat at least 3 REAL meals and 1-2 snacks per day.  Aim for no more than 5 hours between eating.  If you eat breakfast, please do so within one hour of getting up.    Each meal should contain half fruits/vegetables, one quarter protein, and one quarter carbs (no bigger than a computer mouse)   Cut down on sweet beverages. This includes juice, soda, and sweet tea.     Drink at least 1 glass of water with each meal and aim for at least 8 glasses per day   Exercise at least 150 minutes every week.    Preventive Care 59-53 Years Old, Male Preventive care refers to lifestyle choices and visits with your health care provider that can promote health and wellness. This includes:  A yearly physical exam. This is also called an annual well check.  Regular dental and eye exams.  Immunizations.  Screening for certain conditions.  Healthy lifestyle choices, such as eating a healthy diet, getting regular exercise, not using drugs or products that contain nicotine and tobacco, and limiting alcohol use. What can I expect for my preventive care visit? Physical exam Your health care provider will check:  Height and weight. These may be used to calculate body mass index (BMI), which is a measurement that tells if you are at a healthy weight.  Heart rate and blood pressure.  Your skin for abnormal spots. Counseling Your health care provider may ask you questions about:  Alcohol, tobacco, and drug use.  Emotional well-being.  Home and relationship well-being.  Sexual activity.  Eating habits.  Work and work  Statistician. What immunizations do I need?  Influenza (flu) vaccine  This is recommended every year. Tetanus, diphtheria, and pertussis (Tdap) vaccine  You may need a Td booster every 10 years. Varicella (chickenpox) vaccine  You may need this vaccine if you have not already been vaccinated. Zoster (shingles) vaccine  You may need this after age 89. Measles, mumps, and rubella (MMR) vaccine  You may need at least one dose of MMR if you were born in 1957 or later. You may also need a second dose. Pneumococcal conjugate (PCV13) vaccine  You may need this if you have certain conditions and were not previously vaccinated. Pneumococcal polysaccharide (PPSV23) vaccine  You may need one or two doses if you smoke cigarettes or if you have certain conditions. Meningococcal conjugate (MenACWY) vaccine  You may need this if you have certain conditions. Hepatitis A vaccine  You may need this if you have certain conditions or if you travel or work in places where you may be exposed to hepatitis A. Hepatitis B vaccine  You may need this if you have certain conditions or if you travel or work in places where you may be exposed to hepatitis B. Haemophilus influenzae type b (Hib) vaccine  You may need this if you have certain risk factors. Human papillomavirus (HPV) vaccine  If recommended by your health care provider, you may need three doses over 6 months. You may receive vaccines as individual doses or as more than  one vaccine together in one shot (combination vaccines). Talk with your health care provider about the risks and benefits of combination vaccines. What tests do I need? Blood tests  Lipid and cholesterol levels. These may be checked every 5 years, or more frequently if you are over 43 years old.  Hepatitis C test.  Hepatitis B test. Screening  Lung cancer screening. You may have this screening every year starting at age 4 if you have a 30-pack-year history of smoking  and currently smoke or have quit within the past 15 years.  Prostate cancer screening. Recommendations will vary depending on your family history and other risks.  Colorectal cancer screening. All adults should have this screening starting at age 72 and continuing until age 103. Your health care provider may recommend screening at age 49 if you are at increased risk. You will have tests every 1-10 years, depending on your results and the type of screening test.  Diabetes screening. This is done by checking your blood sugar (glucose) after you have not eaten for a while (fasting). You may have this done every 1-3 years.  Sexually transmitted disease (STD) testing. Follow these instructions at home: Eating and drinking  Eat a diet that includes fresh fruits and vegetables, whole grains, lean protein, and low-fat dairy products.  Take vitamin and mineral supplements as recommended by your health care provider.  Do not drink alcohol if your health care provider tells you not to drink.  If you drink alcohol: ? Limit how much you have to 0-2 drinks a day. ? Be aware of how much alcohol is in your drink. In the [place] one drink equals one 12 oz bottle of beer (355 mL), one 5 oz glass of wine (148 mL), or one 1 oz glass of hard liquor (44 mL). Lifestyle  Take daily care of your teeth and gums.  Stay active. Exercise for at least 30 minutes on 5 or more days each week.  Do not use any products that contain nicotine or tobacco, such as cigarettes, e-cigarettes, and chewing tobacco. If you need help quitting, ask your health care provider.  If you are sexually active, practice safe sex. Use a condom or other form of protection to prevent STIs (sexually transmitted infections).  Talk with your health care provider about taking a low-dose aspirin every day starting at age 30. What's next?  Go to your health care provider once a year for a well check visit.  Ask your health care provider how  often you should have your eyes and teeth checked.  Stay up to date on all vaccines. This information is not intended to replace advice given to you by your health care provider. Make sure you discuss any questions you have with your health care provider. Document Revised: 05/29/2018 Document Reviewed: 05/29/2018 Elsevier Patient Education  2020 [employer].

## 2019-09-30 NOTE — Progress Notes (Signed)
Please inform patient of the following:  Cholesterol numbers slightly better than last year.  All his other labs are stable.  Does not need to make any changes to treatment plan.  Recommend he continue working on diet and exercise and we can recheck in year.

## 2019-12-29 ENCOUNTER — Telehealth: Payer: Self-pay | Admitting: Family Medicine

## 2019-12-29 NOTE — Telephone Encounter (Signed)
Please advise 

## 2019-12-29 NOTE — Telephone Encounter (Signed)
Patient called in asking for another surgical referral other than [place].

## 2019-12-30 NOTE — Telephone Encounter (Signed)
Referral sent to Cheryll Cockayne at [facility].

## 2020-04-28 ENCOUNTER — Encounter: Payer: Self-pay | Admitting: Family Medicine

## 2020-04-28 ENCOUNTER — Telehealth (INDEPENDENT_AMBULATORY_CARE_PROVIDER_SITE_OTHER): Payer: 59 | Admitting: Family Medicine

## 2020-04-28 VITALS — Temp 97.0°F | Ht 72.25 in | Wt 210.0 lb

## 2020-04-28 DIAGNOSIS — J329 Chronic sinusitis, unspecified: Secondary | ICD-10-CM | POA: Diagnosis not present

## 2020-04-28 MED ORDER — AZITHROMYCIN 250 MG PO TABS: ORAL_TABLET | ORAL | 0 refills | Status: DC

## 2020-04-28 MED ORDER — AZELASTINE HCL 0.1 % NA SOLN: 2.0000 | Freq: Two times a day (BID) | NASAL | 12 refills | Status: DC

## 2020-04-28 NOTE — Progress Notes (Signed)
   Connor Chapman is a 44 y.o. male who presents today for a virtual office visit.  Assessment/Plan:  Sinusitis  No red flags.  Start Astelin.  We will send in pocket description for azithromycin with instruction not start unless it is no improvement next 2 days.  Can continue over-the-counter meds.  Continue good oral hydration.  Follow-up as needed.  Discussed reasons to return to care.    Subjective:  HPI:  Patient with symptoms for the last several days worsened over the last 4.  Family members have also been sick with similar symptoms.  Has a lot of pain into his teeth and jaw.  Went to the dentist recently they did not see anything wrong.  He has had associated congestion and rhinorrhea.  No fevers or chills.       Objective/Observations  Physical Exam: Gen: NAD, resting comfortably Pulm: Normal work of breathing Neuro: Grossly normal, moves all extremities Psych: Normal affect and thought content  Virtual Visit via Video   I connected with Connor Chapman on 04/28/20 at  8:40 AM EST by a video enabled telemedicine application and verified that I am speaking with the correct person using two identifiers. The limitations of evaluation and management by telemedicine and the availability of in person appointments were discussed. The patient expressed understanding and agreed to proceed.   Patient location: Home Provider location: [place] participating in the virtual visit: Myself and Patient     [name]. Jerline Pain, MD 04/28/2020 8:44 AM

## 2020-09-29 ENCOUNTER — Encounter: Payer: 59 | Admitting: Family Medicine

## 2020-10-05 ENCOUNTER — Encounter: Payer: Self-pay | Admitting: Family Medicine

## 2020-10-05 ENCOUNTER — Ambulatory Visit (INDEPENDENT_AMBULATORY_CARE_PROVIDER_SITE_OTHER): Payer: 59 | Admitting: Family Medicine

## 2020-10-05 ENCOUNTER — Other Ambulatory Visit: Payer: Self-pay

## 2020-10-05 VITALS — BP 122/70 | HR 57 | Temp 98.3°F | Ht 72.0 in | Wt 209.2 lb

## 2020-10-05 DIAGNOSIS — M25561 Pain in right knee: Secondary | ICD-10-CM

## 2020-10-05 DIAGNOSIS — G8929 Other chronic pain: Secondary | ICD-10-CM

## 2020-10-05 DIAGNOSIS — Z6828 Body mass index (BMI) 28.0-28.9, adult: Secondary | ICD-10-CM

## 2020-10-05 DIAGNOSIS — Z1211 Encounter for screening for malignant neoplasm of colon: Secondary | ICD-10-CM

## 2020-10-05 DIAGNOSIS — E785 Hyperlipidemia, unspecified: Secondary | ICD-10-CM

## 2020-10-05 DIAGNOSIS — M25562 Pain in left knee: Secondary | ICD-10-CM

## 2020-10-05 DIAGNOSIS — Z0001 Encounter for general adult medical examination with abnormal findings: Secondary | ICD-10-CM | POA: Diagnosis not present

## 2020-10-05 DIAGNOSIS — E663 Overweight: Secondary | ICD-10-CM

## 2020-10-05 LAB — CBC
HCT: 44.1 % (ref 39.0–52.0)
Hemoglobin: 15.1 g/dL (ref 13.0–17.0)
MCHC: 34.2 g/dL (ref 30.0–36.0)
MCV: 86.3 fl (ref 78.0–100.0)
Platelets: 196 10*3/uL (ref 150.0–400.0)
RBC: 5.11 Mil/uL (ref 4.22–5.81)
RDW: 12.6 % (ref 11.5–15.5)
WBC: 3.5 10*3/uL — ABNORMAL LOW (ref 4.0–10.5)

## 2020-10-05 LAB — LIPID PANEL
Cholesterol: 199 mg/dL (ref 0–200)
HDL: 58.7 mg/dL (ref >39.00)
LDL Cholesterol: 126 mg/dL — ABNORMAL HIGH (ref 0–99)
NonHDL: 140.43
Total CHOL/HDL Ratio: 3
Triglycerides: 71 mg/dL (ref 0.0–149.0)
VLDL: 14.2 mg/dL (ref 0.0–40.0)

## 2020-10-05 LAB — TSH: TSH: 1.7 u[IU]/mL (ref 0.35–4.50)

## 2020-10-05 LAB — COMPREHENSIVE METABOLIC PANEL
ALT: 13 U/L (ref 0–53)
AST: 17 U/L (ref 0–37)
Albumin: 4.2 g/dL (ref 3.5–5.2)
Alkaline Phosphatase: 66 U/L (ref 39–117)
BUN: 13 mg/dL (ref 6–23)
CO2: 26 mEq/L (ref 19–32)
Calcium: 9 mg/dL (ref 8.4–10.5)
Chloride: 102 mEq/L (ref 96–112)
Creatinine, Ser: 0.9 mg/dL (ref 0.40–1.50)
GFR: 104.07 mL/min (ref >60.00)
Glucose, Bld: 94 mg/dL (ref 70–99)
Potassium: 3.9 mEq/L (ref 3.5–5.1)
Sodium: 137 mEq/L (ref 135–145)
Total Bilirubin: 1.1 mg/dL (ref 0.2–1.2)
Total Protein: 6.6 g/dL (ref 6.0–8.3)

## 2020-10-05 NOTE — Assessment & Plan Note (Signed)
Check labs.  Discussed lifestyle modifications. 

## 2020-10-05 NOTE — Patient Instructions (Signed)
It was very nice to see you today!  We will check blood work today and place a referral for you to get your colonoscopy.  Please continue working on diet and exercise.  I will see back in year for your next physical.  Come back to see me sooner if needed.  Take care, Dr Jerline Pain  PLEASE NOTE:  If you had any lab tests please let us know if you have not heard back within a few days. You may see your results on mychart before we have a chance to review them but we will give you a call once they are reviewed by [place]. If we ordered any referrals today, please let us know if you have not heard from their office within the next week.   Please try these tips to maintain a healthy lifestyle:   Eat at least 3 REAL meals and 1-2 snacks per day.  Aim for no more than 5 hours between eating.  If you eat breakfast, please do so within one hour of getting up.    Each meal should contain half fruits/vegetables, one quarter protein, and one quarter carbs (no bigger than a computer mouse)   Cut down on sweet beverages. This includes juice, soda, and sweet tea.     Drink at least 1 glass of water with each meal and aim for at least 8 glasses per day   Exercise at least 150 minutes every week.    Preventive Care 75-68 Years Old, Male Preventive care refers to lifestyle choices and visits with your health care provider that can promote health and wellness. This includes:  A yearly physical exam. This is also called an annual wellness visit.  Regular dental and eye exams.  Immunizations.  Screening for certain conditions.  Healthy lifestyle choices, such as: ? Eating a healthy diet. ? Getting regular exercise. ? Not using drugs or products that contain nicotine and tobacco. ? Limiting alcohol use. What can I expect for my preventive care visit? Physical exam Your health care provider will check your:  Height and weight. These may be used to calculate your BMI (body mass index). BMI is a  measurement that tells if you are at a healthy weight.  Heart rate and blood pressure.  Body temperature.  Skin for abnormal spots. Counseling Your health care provider may ask you questions about your:  Past medical problems.  Family's medical history.  Alcohol, tobacco, and drug use.  Emotional well-being.  Home life and relationship well-being.  Sexual activity.  Diet, exercise, and sleep habits.  Work and work Statistician.  Access to firearms. What immunizations do I need? Vaccines are usually given at various ages, according to a schedule. Your health care provider will recommend vaccines for you based on your age, medical history, and lifestyle or other factors, such as travel or where you work.   What tests do I need? Blood tests  Lipid and cholesterol levels. These may be checked every 5 years, or more often if you are over 30 years old.  Hepatitis C test.  Hepatitis B test. Screening  Lung cancer screening. You may have this screening every year starting at age 90 if you have a 30-pack-year history of smoking and currently smoke or have quit within the past 15 years.  Prostate cancer screening. Recommendations will vary depending on your family history and other risks.  Genital exam to check for testicular cancer or hernias.  Colorectal cancer screening. ? All adults should have this screening  starting at age 23 and continuing until age 37. ? Your health care provider may recommend screening at age 1 if you are at increased risk. ? You will have tests every 1-10 years, depending on your results and the type of screening test.  Diabetes screening. ? This is done by checking your blood sugar (glucose) after you have not eaten for a while (fasting). ? You may have this done every 1-3 years.  STD (sexually transmitted disease) testing, if you are at risk. Follow these instructions at home: Eating and drinking  Eat a diet that includes fresh fruits and  vegetables, whole grains, lean protein, and low-fat dairy products.  Take vitamin and mineral supplements as recommended by your health care provider.  Do not drink alcohol if your health care provider tells you not to drink.  If you drink alcohol: ? Limit how much you have to 0-2 drinks a day. ? Be aware of how much alcohol is in your drink. In the [place] one drink equals one 12 oz bottle of beer (355 mL), one 5 oz glass of wine (148 mL), or one 1 oz glass of hard liquor (44 mL).   Lifestyle  Take daily care of your teeth and gums. Brush your teeth every morning and night with fluoride toothpaste. Floss one time each day.  Stay active. Exercise for at least 30 minutes 5 or more days each week.  Do not use any products that contain nicotine or tobacco, such as cigarettes, e-cigarettes, and chewing tobacco. If you need help quitting, ask your health care provider.  Do not use drugs.  If you are sexually active, practice safe sex. Use a condom or other form of protection to prevent STIs (sexually transmitted infections).  If told by your health care provider, take low-dose aspirin daily starting at age 57.  Find healthy ways to cope with stress, such as: ? Meditation, yoga, or listening to music. ? Journaling. ? Talking to a trusted person. ? Spending time with friends and family. Safety  Always wear your seat belt while driving or riding in a vehicle.  Do not drive: ? If you have been drinking alcohol. Do not ride with someone who has been drinking. ? When you are tired or distracted. ? While texting.  Wear a helmet and other protective equipment during sports activities.  If you have firearms in your house, make sure you follow all gun safety procedures. What's next?  Go to your health care provider once a year for an annual wellness visit.  Ask your health care provider how often you should have your eyes and teeth checked.  Stay up to date on all vaccines. This  information is not intended to replace advice given to you by your health care provider. Make sure you discuss any questions you have with your health care provider. Document Revised: 03/03/2019 Document Reviewed: 05/29/2018 Elsevier Patient Education  2021 [employer].

## 2020-10-05 NOTE — Assessment & Plan Note (Signed)
Likely due to osteoarthritis.  Recommended over-the-counter meds as needed.  Also recommended ice and compression as needed as well.

## 2020-10-05 NOTE — Progress Notes (Signed)
Chief Complaint:  Connor Chapman is a 45 y.o. male who presents today for his annual comprehensive physical exam.    Assessment/Plan:  Chronic Problems Addressed Today: Chronic pain of both knees Likely due to osteoarthritis.  Recommended over-the-counter meds as needed.  Also recommended ice and compression as needed as well.  Dyslipidemia Check labs.  Discussed lifestyle modifications.   Body mass index is 28.38 kg/m. / Overweight  BMI Metric Follow Up - 10/05/20 0943      BMI Metric Follow Up-Please document annually   BMI Metric Follow Up Education provided           Preventative Healthcare: Check labs today.  Will place referral for colon cancer screening.  Patient Counseling(The following topics were reviewed and/or handout was given):  -Nutrition: Stressed importance of moderation in sodium/caffeine intake, saturated fat and cholesterol, caloric balance, sufficient intake of fresh fruits, vegetables, and fiber.  -Stressed the importance of regular exercise.   -Substance Abuse: Discussed cessation/primary prevention of tobacco, alcohol, or other drug use; driving or other dangerous activities under the influence; availability of treatment for abuse.   -Injury prevention: Discussed safety belts, safety helmets, smoke detector, smoking near bedding or upholstery.   -Sexuality: Discussed sexually transmitted diseases, partner selection, use of condoms, avoidance of unintended pregnancy and contraceptive alternatives.   -Dental health: Discussed importance of regular tooth brushing, flossing, and dental visits.  -Health maintenance and immunizations reviewed. Please refer to Health maintenance section.  Return to care in 1 year for next preventative visit.     Subjective:  HPI:  He has no acute complaints today.   Lifestyle Diet: Balanced.  Exercise: Working on cardio and weight training.   Depression screen [facility] 2/9 10/05/2020  Decreased Interest 0  Down,  Depressed, Hopeless 0  PHQ - 2 Score 0   ROS: Per HPI, otherwise a complete review of systems was negative.   PMH:  The following were reviewed and entered/updated in epic: Past Medical History:  Diagnosis Date  . Anal fissure   . Dermatitis, seborrheic   . Hemorrhoids   . Hyperlipidemia    Patient Active Problem List   Diagnosis Date Noted  . Anal fissure 05/06/2018  . Penile cyst 05/06/2018  . H/O cold sores 05/06/2018  . Seborrheic dermatitis 05/06/2018  . Dyslipidemia 05/06/2018  . Chronic pain of both knees 05/06/2018   Past Surgical History:  Procedure Laterality Date  . CHOLECYSTECTOMY  2004    Family History  Problem Relation Age of Onset  . Cancer Mother        Tumor in knee  . Pancreatic cancer Paternal Grandfather   . Hypertension Paternal Grandfather   . Cancer Paternal Grandmother     Medications- reviewed and updated Current Outpatient Medications  Medication Sig Dispense Refill  . valACYclovir (VALTREX) 500 MG tablet Take 500 mg by mouth as needed.      No current facility-administered medications for this visit.    Allergies-reviewed and updated Allergies  Allergen Reactions  . Penicillins Swelling    Social History   Socioeconomic History  . Marital status: Married    Spouse name: Not on file  . Number of children: Not on file  . Years of education: Not on file  . Highest education level: Not on file  Occupational History  . Not on file  Tobacco Use  . Smoking status: Former Research scientist (life sciences)  . Smokeless tobacco: Never Used  . Tobacco comment: Quit in 2002  Substance and Sexual Activity  .  Alcohol use: Yes  . Drug use: Never  . Sexual activity: Yes  Other Topics Concern  . Not on file  Social History Narrative  . Not on file   Social Determinants of Health   Financial Resource Strain: Not on file  Food Insecurity: Not on file  Transportation Needs: Not on file  Physical Activity: Not on file  Stress: Not on file  Social  Connections: Not on file        Objective:  Physical Exam: BP 122/70   Pulse (!) 57   Temp 98.3 F (36.8 C)   Ht 6' (1.829 m)   Wt 209 lb 4 oz (94.9 kg)   SpO2 98%   BMI 28.38 kg/m   Body mass index is 28.38 kg/m. Wt Readings from Last 3 Encounters:  10/05/20 209 lb 4 oz (94.9 kg)  04/28/20 210 lb (95.3 kg)  09/29/19 220 lb (99.8 kg)   Gen: NAD, resting comfortably HEENT: TMs normal bilaterally. OP clear. No thyromegaly noted.  CV: RRR with no murmurs appreciated Pulm: NWOB, CTAB with no crackles, wheezes, or rhonchi GI: Normal bowel sounds present. Soft, Nontender, Nondistended. MSK: no edema, cyanosis, or clubbing noted Skin: warm, dry Neuro: CN2-12 grossly intact. Strength 5/5 in upper and lower extremities. Reflexes symmetric and intact bilaterally.  Psych: Normal affect and thought content     Omari Mcmanaway M. Jerline Pain, MD 10/05/2020 9:44 AM

## 2020-10-06 NOTE — Progress Notes (Signed)
Please inform patient of the following:  His "bad" cholesterol is mildly elevated but everything else is normal.  Cholesterol is better than last year.  Do not need to start medications.  He should continue working on diet and exercise and we can recheck in year.

## 2020-10-18 ENCOUNTER — Encounter: Payer: Self-pay | Admitting: Gastroenterology

## 2020-10-28 ENCOUNTER — Encounter: Payer: Self-pay | Admitting: Family Medicine

## 2020-10-28 ENCOUNTER — Ambulatory Visit: Payer: 59 | Admitting: Family Medicine

## 2020-10-28 ENCOUNTER — Other Ambulatory Visit: Payer: Self-pay

## 2020-10-28 VITALS — BP 104/62 | HR 65 | Temp 98.3°F | Ht 72.0 in | Wt 206.6 lb

## 2020-10-28 DIAGNOSIS — N50812 Left testicular pain: Secondary | ICD-10-CM

## 2020-10-28 NOTE — Patient Instructions (Signed)
It was very nice to see you today!  We will check an ultrasound.  Pending on the results we may need to get you to see a urologist.  I will refer you to see a surgeon if you have a hernia.  He can continue taking over-the-counter meds as needed.  Take care, Dr Jerline Pain  PLEASE NOTE:  If you had any lab tests please let us know if you have not heard back within a few days. You may see your results on mychart before we have a chance to review them but we will give you a call once they are reviewed by [place]. If we ordered any referrals today, please let us know if you have not heard from their office within the next week.   Please try these tips to maintain a healthy lifestyle:   Eat at least 3 REAL meals and 1-2 snacks per day.  Aim for no more than 5 hours between eating.  If you eat breakfast, please do so within one hour of getting up.    Each meal should contain half fruits/vegetables, one quarter protein, and one quarter carbs (no bigger than a computer mouse)   Cut down on sweet beverages. This includes juice, soda, and sweet tea.     Drink at least 1 glass of water with each meal and aim for at least 8 glasses per day   Exercise at least 150 minutes every week.

## 2020-10-28 NOTE — Progress Notes (Signed)
   Connor Chapman is a 45 y.o. male who presents today for an office visit.  Assessment/Plan:  New/Acute Problems: Left testicular pain Has history of pelvic floor dysfunction which could be contributing though given progressive and worsening nature of testicular pain will check ultrasound to further evaluate and rule out other potential causes such as hernia or torsion.  He can continue over-the-counter meds as needed.  Depending on results of ultrasound will need referral to urology or surgery.    Subjective:  HPI:  Pain in left testicle for a few months. Symptoms are getting worse. Symptoms come and go. No urinary symptoms. No hematuria. No nausea or vomiting.  No lumps noted. No bulges noted. Certain positions make it worse. Tylenol and ibuprofen help.        Objective:  Physical Exam: BP 104/62   Pulse 65   Temp 98.3 F (36.8 C)   Ht 6' (1.829 m)   Wt 206 lb 9.6 oz (93.7 kg)   SpO2 98%   BMI 28.02 kg/m   Gen: No acute distress, resting comfortably CV: Regular rate and rhythm with no murmurs appreciated Pulm: Normal work of breathing, clear to auscultation bilaterally with no crackles, wheezes, or rhonchi GU: Normal male genitalia.  No obvious abnormalities of left  testicle.  No hernia appreciated. Neuro: Grossly normal, moves all extremities Psych: Normal affect and thought content      Amour Cutrone M. Jerline Pain, MD 10/28/2020 2:05 PM

## 2020-11-01 ENCOUNTER — Other Ambulatory Visit: Payer: Self-pay | Admitting: *Deleted

## 2020-11-01 DIAGNOSIS — N50812 Left testicular pain: Secondary | ICD-10-CM

## 2020-11-03 ENCOUNTER — Encounter: Payer: Self-pay | Admitting: Gastroenterology

## 2020-11-03 ENCOUNTER — Ambulatory Visit: Payer: 59 | Admitting: Gastroenterology

## 2020-11-03 VITALS — BP 118/80 | HR 64 | Ht 72.0 in | Wt 206.4 lb

## 2020-11-03 DIAGNOSIS — K601 Chronic anal fissure: Secondary | ICD-10-CM | POA: Diagnosis not present

## 2020-11-03 MED ORDER — AMBULATORY NON FORMULARY MEDICATION: 3 refills | Status: DC

## 2020-11-03 NOTE — Progress Notes (Signed)
Agree with assessment and plan as outlined.  He is due for colonoscopy in January for routine screening purposes. If symptoms persist despite measures as outlined may consider doing it sooner.

## 2020-11-03 NOTE — Progress Notes (Signed)
     11/03/2020 Connor Chapman [id number] [birth year]   HISTORY OF PRESENT ILLNESS:  This is a 45 year old male who was initially here today to talk about a screening colonoscopy.  He has family history of colon cancer in a grandfather, but no other family members.  He says that he has an anal fissure that has been present since he was in college.  He sees occasional bright red blood per rectum related to that, which has been ongoing for years.  He says he used the higher dose nitroglycerin gel in the past, but it gave him an instant headache.  He has really not treated it with anything over the past several years.  Overall he moves his bowels without issues.   Past Medical History:  Diagnosis Date  . Anal fissure   . Dermatitis, seborrheic   . Gallstones   . Hemorrhoids   . Hyperlipidemia    Past Surgical History:  Procedure Laterality Date  . CHOLECYSTECTOMY  2004    reports that he has quit smoking. He has never used smokeless tobacco. He reports current alcohol use. He reports that he does not use drugs. family history includes Arrhythmia in his father; Cancer in his mother; Colon cancer in an other family member; Hypertension in his paternal grandfather; Pancreatic cancer in his paternal grandfather. Allergies  Allergen Reactions  . Penicillins Swelling      Outpatient Encounter Medications as of 11/03/2020  Medication Sig  . AMBULATORY NON FORMULARY MEDICATION Nitrogel 0.125 %  . valACYclovir (VALTREX) 500 MG tablet Take 500 mg by mouth as needed.    No facility-administered encounter medications on file as of 11/03/2020.    REVIEW OF SYSTEMS  : All other systems reviewed and negative except where noted in the History of Present Illness.   PHYSICAL EXAM: BP 118/80   Pulse 64   Ht 6' (1.829 m)   Wt 206 lb 6.4 oz (93.6 kg)   BMI 27.99 kg/m  General: Well developed white male in no acute distress Head: Normocephalic and atraumatic Eyes:  Sclerae anicteric,  conjunctiva pink. Ears: Normal auditory acuity Lungs: Clear throughout to auscultation; no W/R/R. Heart: Regular rate and rhythm; no M/R/G. Abdomen: Soft, non-distended.  BS present.  Non-tender. Rectal:  Posterior anal fissure noted with small amount of bleeding.  DRE did not reveal any masses.   Musculoskeletal: Symmetrical with no gross deformities  Skin: No lesions on visible extremities Extremities: No edema  Neurological: Alert oriented x 4, grossly non-focal Psychological:  Alert and cooperative. Normal mood and affect  ASSESSMENT AND PLAN: *Chronic posterior anal fissure:  Seen on exam today with small amount of bleeding.  Says that it has been there since college.  Tried high dose nitro in the past but it gave him an instant headache.  Will try nitro gel 0.125% BID for the next several weeks.  Prescription sent to pharmacy. He will call back and let us know how he is doing.  May be worth switching to diltiazem if no improvement on nitro.  Keep stools soft, prevent straining.  CC:  Vivi Barrack, MD

## 2020-11-03 NOTE — Patient Instructions (Signed)
If you are age 44 or older, your body mass index should be between 23-30. Your Body mass index is 27.99 kg/m. If this is out of the aforementioned range listed, please consider follow up with your Primary Care Provider.  If you are age 76 or younger, your body mass index should be between 19-25. Your Body mass index is 27.99 kg/m. If this is out of the aformentioned range listed, please consider follow up with your Primary Care Provider.   We have sent a prescription for nitroglycerin 0.125% gel to [facility]. You should apply a pea size amount to your rectum three times daily x 6-8 weeks.  [facility]'s information is below: Address: [address] [place], [place] [address]  [phone]  *Please DO NOT go directly from our office to pick up this medication! Give the pharmacy 1 day to process the prescription as this is compounded and takes time to make.  Thank you for choosing me and [facility].  Alonza Bogus, PA-C

## 2020-11-11 ENCOUNTER — Telehealth: Payer: Self-pay

## 2020-11-11 NOTE — Telephone Encounter (Signed)
Received a call from April at [facility], states patient has a referral for [place] unlimited with a diagnosis of left testicular pain. April states the order is needing to be changed if the diagnosis is the pain as [place] [employer] is typically for hernia or soft tissue exam.

## 2020-11-15 ENCOUNTER — Other Ambulatory Visit: Payer: Self-pay | Admitting: *Deleted

## 2020-11-15 DIAGNOSIS — K458 Other specified abdominal hernia without obstruction or gangrene: Secondary | ICD-10-CM

## 2020-11-15 NOTE — Telephone Encounter (Signed)
Spoke with April  Referral order

## 2020-11-18 ENCOUNTER — Inpatient Hospital Stay: Admission: RE | Admit: 2020-11-18 | Payer: 59 | Source: Ambulatory Visit

## 2020-11-18 ENCOUNTER — Other Ambulatory Visit: Payer: 59

## 2021-06-20 ENCOUNTER — Telehealth: Payer: Self-pay

## 2021-06-20 NOTE — Telephone Encounter (Signed)
Patient states he is flying on Friday 1/6.  States he has a fear of flying.  Would like to know if Dr. Jerline Pain could prescribe him something. Patient uses [facility] on [address].

## 2021-06-21 NOTE — Telephone Encounter (Signed)
Needs to be scheduled

## 2021-06-21 NOTE — Telephone Encounter (Signed)
Please have him schedule an appointment. WE can not send in new rx without an appointment.  [name]. Jerline Pain, MD 06/21/2021 9:18 AM

## 2021-06-21 NOTE — Telephone Encounter (Signed)
Pt called back and just wanted to know something today so he knows if he needs to make an appt or not. Call back is [phone]

## 2021-06-21 NOTE — Telephone Encounter (Signed)
Tried to call to schedule, lvm

## 2021-06-21 NOTE — Telephone Encounter (Signed)
Please advise 

## 2021-06-21 NOTE — Telephone Encounter (Signed)
Pt scheduled  

## 2021-06-22 ENCOUNTER — Other Ambulatory Visit: Payer: Self-pay

## 2021-06-22 ENCOUNTER — Ambulatory Visit: Payer: 59 | Admitting: Physician Assistant

## 2021-06-22 ENCOUNTER — Encounter: Payer: Self-pay | Admitting: Physician Assistant

## 2021-06-22 VITALS — BP 110/80 | HR 68 | Temp 98.2°F | Ht 72.0 in | Wt 203.4 lb

## 2021-06-22 DIAGNOSIS — F418 Other specified anxiety disorders: Secondary | ICD-10-CM

## 2021-06-22 DIAGNOSIS — G4700 Insomnia, unspecified: Secondary | ICD-10-CM

## 2021-06-22 MED ORDER — LORAZEPAM 0.5 MG PO TABS: 0.5000 mg | ORAL_TABLET | Freq: Two times a day (BID) | ORAL | 0 refills | Status: DC | PRN

## 2021-06-22 NOTE — Progress Notes (Signed)
Connor Chapman is a 46 y.o. male here for travel anxiety.  History of Present Illness:   Chief Complaint  Patient presents with   Anxiety    Pt is here to discuss prescribing something for anxiety for upcoming flight.   Insomnia    HPI  Anxiety Connor Chapman presents with c/o increased anxiety, specifically when it comes to flying or being in a car for prolong amounts of time. Initially he had a upcoming family trip that required him to fly to [place], [place], but due to increased anxiety he had canceled his flight and opted to take the train instead. At this time he is not interested in having to treated this issue with medication, rather wanting to participate in talk therapy to get to the root of his anxiety. Denies SI/HI , concerns for addiction, or feelings on depression.   Insomnia In addition to his increased anxiety surrounding his upcoming trip, he has been experiencing insomnia for the past week. Currently he is interested in trialing something for this issue especially since he has this trip coming up.   Past Medical History:  Diagnosis Date   Anal fissure    Dermatitis, seborrheic    Gallstones    Hemorrhoids    Hyperlipidemia      Social History   Tobacco Use   Smoking status: Former   Smokeless tobacco: Never   Tobacco comments:    Quit in 2002  Substance Use Topics   Alcohol use: Yes    Comment: less than 2 drinks daily   Drug use: Never    Past Surgical History:  Procedure Laterality Date   CHOLECYSTECTOMY  2004    Family History  Problem Relation Age of Onset   Cancer Mother        Tumor in knee   Pancreatic cancer Paternal Grandfather    Hypertension Paternal Grandfather    Arrhythmia Father    Colon cancer Other        Great Grandfather    Allergies  Allergen Reactions   Penicillins Swelling    Current Medications:   Current Outpatient Medications:    AMBULATORY NON FORMULARY MEDICATION, Nitrogel 0.125 %, Disp: 30 g, Rfl: 3   valACYclovir  (VALTREX) 500 MG tablet, Take 500 mg by mouth as needed. , Disp: , Rfl:    Review of Systems:   ROS Negative unless otherwise specified per HPI. Vitals:   Vitals:   06/22/21 1134  BP: 110/80  Pulse: 68  Temp: 98.2 F (36.8 C)  TempSrc: Temporal  SpO2: 97%  Weight: 203 lb 6.1 oz (92.3 kg)  Height: 6' (1.829 m)     Body mass index is 27.58 kg/m.  Physical Exam:   Physical Exam Vitals and nursing note reviewed.  Constitutional:      General: He is not in acute distress.    Appearance: He is well-developed. He is not ill-appearing or toxic-appearing.  Cardiovascular:     Rate and Rhythm: Normal rate and regular rhythm.     Pulses: Normal pulses.     Heart sounds: Normal heart sounds, S1 normal and S2 normal.  Pulmonary:     Effort: Pulmonary effort is normal.     Breath sounds: Normal breath sounds.  Skin:    General: Skin is warm and dry.  Neurological:     Mental Status: He is alert.     GCS: GCS eye subscore is 4. GCS verbal subscore is 5. GCS motor subscore is 6.  Psychiatric:  Speech: Speech normal.        Behavior: Behavior normal. Behavior is cooperative.    Assessment and Plan:   Situational Anxiety Uncontrolled No red flags upon discussion; Denies SI/HI Start Ativan 0.5 mg twice daily as needed Talk therapy referral placed today I advised patient that if they develop any SI, to tell someone immediately and seek medical attention Recommended patient follow up with PCP or I, to further discuss long term medication if desired  Insomnia Uncontrolled Start Ativan 0.5 mg twice daily as needed -- discussed that this is a temporary treatment option for his symptoms and we may need to consider trazodone or other safer long term option if he feels like he needs this more often than not  I,Havlyn C Ratchford,acting as a scribe for [employer], PA.,have documented all relevant documentation on the behalf of Inda Coke, PA,as directed by  Inda Coke, PA while in the presence of Inda Coke, [place].  I, Inda Coke, [place], have reviewed all documentation for this visit. The documentation on 06/22/21 for the exam, diagnosis, procedures, and orders are all accurate and complete.   Inda Coke, PA-C

## 2021-06-22 NOTE — Patient Instructions (Signed)
It was great to see you!  You may use ativan up to two times a day as needed for anxiety or sleep.  This is not a long term medication, consider daily medication if you feel like you would benefit from something more regularly. Follow-up with Dr. Jerline Pain to discuss.  If any suicidal thoughts, please tell someone and go to the ER.  Take care,  Inda Coke PA-C

## 2021-09-28 ENCOUNTER — Ambulatory Visit (AMBULATORY_SURGERY_CENTER): Payer: 59

## 2021-09-28 VITALS — Ht 73.0 in | Wt 206.0 lb

## 2021-09-28 DIAGNOSIS — Z1211 Encounter for screening for malignant neoplasm of colon: Secondary | ICD-10-CM

## 2021-09-28 MED ORDER — NA SULFATE-K SULFATE-MG SULF 17.5-3.13-1.6 GM/177ML PO SOLN: 1.0000 | Freq: Once | ORAL | 0 refills | Status: AC

## 2021-09-28 NOTE — Progress Notes (Signed)
No egg or soy allergy known to patient  ?No issues known to pt with past sedation with any surgeries or procedures ?Patient denies ever being told they had issues or difficulty with intubation  ?No FH of Malignant Hyperthermia ?Pt is not on diet pills ?Pt is not on home 02  ?Pt is not on blood thinners  ?Pt denies issues with constipation;  ?No A fib or A flutter ?Patient advised to use SingleCare for price reduction for prep; ?NO PA's for preps discussed with pt in PV today  ?Discussed with pt there will be an out-of-pocket cost for prep and that varies from $0 to 70 + dollars - pt verbalized understanding  ?Due to the COVID-19 pandemic we are asking patients to follow certain guidelines in PV and the [place]   ?Pt aware of COVID protocols and [facility] guidelines  ?PV completed over the phone. Pt verified name, DOB, address and insurance during PV today.  ?Pt mailed instruction packet with copy of consent form to read and not return, and instructions.  ?Pt encouraged to call with questions or issues.  ?If pt has My chart, procedure instructions sent via My Chart  ? ?

## 2021-10-03 ENCOUNTER — Encounter: Payer: Self-pay | Admitting: Gastroenterology

## 2021-10-04 ENCOUNTER — Encounter: Payer: Self-pay | Admitting: Certified Registered Nurse Anesthetist

## 2021-10-06 ENCOUNTER — Encounter: Payer: Self-pay | Admitting: Family Medicine

## 2021-10-06 ENCOUNTER — Ambulatory Visit (INDEPENDENT_AMBULATORY_CARE_PROVIDER_SITE_OTHER): Payer: 59 | Admitting: Family Medicine

## 2021-10-06 VITALS — BP 114/73 | HR 55 | Temp 98.3°F | Ht 73.0 in | Wt 205.2 lb

## 2021-10-06 DIAGNOSIS — Z0001 Encounter for general adult medical examination with abnormal findings: Secondary | ICD-10-CM | POA: Diagnosis not present

## 2021-10-06 DIAGNOSIS — R739 Hyperglycemia, unspecified: Secondary | ICD-10-CM | POA: Diagnosis not present

## 2021-10-06 DIAGNOSIS — E785 Hyperlipidemia, unspecified: Secondary | ICD-10-CM

## 2021-10-06 DIAGNOSIS — F418 Other specified anxiety disorders: Secondary | ICD-10-CM

## 2021-10-06 DIAGNOSIS — M6289 Other specified disorders of muscle: Secondary | ICD-10-CM

## 2021-10-06 DIAGNOSIS — N529 Male erectile dysfunction, unspecified: Secondary | ICD-10-CM | POA: Diagnosis not present

## 2021-10-06 LAB — COMPREHENSIVE METABOLIC PANEL
ALT: 13 U/L (ref 0–53)
AST: 16 U/L (ref 0–37)
Albumin: 4.3 g/dL (ref 3.5–5.2)
Alkaline Phosphatase: 60 U/L (ref 39–117)
BUN: 13 mg/dL (ref 6–23)
CO2: 26 mEq/L (ref 19–32)
Calcium: 8.5 mg/dL (ref 8.4–10.5)
Chloride: 107 mEq/L (ref 96–112)
Creatinine, Ser: 0.92 mg/dL (ref 0.40–1.50)
GFR: 100.65 mL/min (ref >60.00)
Glucose, Bld: 97 mg/dL (ref 70–99)
Potassium: 4.1 mEq/L (ref 3.5–5.1)
Sodium: 140 mEq/L (ref 135–145)
Total Bilirubin: 1 mg/dL (ref 0.2–1.2)
Total Protein: 6.4 g/dL (ref 6.0–8.3)

## 2021-10-06 LAB — LIPID PANEL
Cholesterol: 197 mg/dL (ref 0–200)
HDL: 58.7 mg/dL (ref >39.00)
LDL Cholesterol: 125 mg/dL — ABNORMAL HIGH (ref 0–99)
NonHDL: 138.72
Total CHOL/HDL Ratio: 3
Triglycerides: 68 mg/dL (ref 0.0–149.0)
VLDL: 13.6 mg/dL (ref 0.0–40.0)

## 2021-10-06 LAB — CBC
HCT: 43.6 % (ref 39.0–52.0)
Hemoglobin: 14.9 g/dL (ref 13.0–17.0)
MCHC: 34.3 g/dL (ref 30.0–36.0)
MCV: 87 fl (ref 78.0–100.0)
Platelets: 186 10*3/uL (ref 150.0–400.0)
RBC: 5.02 Mil/uL (ref 4.22–5.81)
RDW: 12.9 % (ref 11.5–15.5)
WBC: 3.4 10*3/uL — ABNORMAL LOW (ref 4.0–10.5)

## 2021-10-06 LAB — HEMOGLOBIN A1C: Hgb A1c MFr Bld: 5.4 % (ref 4.6–6.5)

## 2021-10-06 LAB — TSH: TSH: 1.49 u[IU]/mL (ref 0.35–5.50)

## 2021-10-06 MED ORDER — TADALAFIL 10 MG PO TABS: 5.0000 mg | ORAL_TABLET | ORAL | 1 refills | Status: DC | PRN

## 2021-10-06 NOTE — Assessment & Plan Note (Signed)
Check labs.  Discussed lifestyle modifications. 

## 2021-10-06 NOTE — Patient Instructions (Signed)
It was very nice to see you today! ? ?We will check blood work today. ? ?I will refer you to see a therapist for your anxiety when flying. ? ?Also send referral for you to see the pelvic floor specialist. ? ?You are probably having some strain of the muscles in your lower abdomen.  I do not see any signs of a hernia today.  Please let me know if this does not improve with therapy. ? ?Please try the Cialis. ? ?I will see back in a year for your next physical.  Come back sooner if needed. ? ?Take care, ?Dr Jerline Pain ? ?PLEASE NOTE: ? ?If you had any lab tests please let us know if you have not heard back within a few days. You may see your results on mychart before we have a chance to review them but we will give you a call once they are reviewed by [place]. If we ordered any referrals today, please let us know if you have not heard from their office within the next week.  ? ?Please try these tips to maintain a healthy lifestyle: ? ?Eat at least 3 REAL meals and 1-2 snacks per day.  Aim for no more than 5 hours between eating.  If you eat breakfast, please do so within one hour of getting up.  ? ?Each meal should contain half fruits/vegetables, one quarter protein, and one quarter carbs (no bigger than a computer mouse) ? ?Cut down on sweet beverages. This includes juice, soda, and sweet tea.  ? ?Drink at least 1 glass of water with each meal and aim for at least 8 glasses per day ? ?Exercise at least 150 minutes every week.   ? ?Preventive Care 37-12 Years Old, Male ?Preventive care refers to lifestyle choices and visits with your health care provider that can promote health and wellness. Preventive care visits are also called wellness exams. ?What can I expect for my preventive care visit? ?Counseling ?During your preventive care visit, your health care provider may ask about your: ?Medical history, including: ?Past medical problems. ?Family medical history. ?Current health, including: ?Emotional well-being. ?Home life and  relationship well-being. ?Sexual activity. ?Lifestyle, including: ?Alcohol, nicotine or tobacco, and drug use. ?Access to firearms. ?Diet, exercise, and sleep habits. ?Safety issues such as seatbelt and bike helmet use. ?Sunscreen use. ?Work and work Statistician. ?Physical exam ?Your health care provider will check your: ?Height and weight. These may be used to calculate your BMI (body mass index). BMI is a measurement that tells if you are at a healthy weight. ?Waist circumference. This measures the distance around your waistline. This measurement also tells if you are at a healthy weight and may help predict your risk of certain diseases, such as type 2 diabetes and high blood pressure. ?Heart rate and blood pressure. ?Body temperature. ?Skin for abnormal spots. ?What immunizations do I need? ? ?Vaccines are usually given at various ages, according to a schedule. Your health care provider will recommend vaccines for you based on your age, medical history, and lifestyle or other factors, such as travel or where you work. ?What tests do I need? ?Screening ?Your health care provider may recommend screening tests for certain conditions. This may include: ?Lipid and cholesterol levels. ?Diabetes screening. This is done by checking your blood sugar (glucose) after you have not eaten for a while (fasting). ?Hepatitis B test. ?Hepatitis C test. ?HIV (human immunodeficiency virus) test. ?STI (sexually transmitted infection) testing, if you are at risk. ?Lung cancer screening. ?  Prostate cancer screening. ?Colorectal cancer screening. ?Talk with your health care provider about your test results, treatment options, and if necessary, the need for more tests. ?Follow these instructions at home: ?Eating and drinking ? ?Eat a diet that includes fresh fruits and vegetables, whole grains, lean protein, and low-fat dairy products. ?Take vitamin and mineral supplements as recommended by your health care provider. ?Do not drink  alcohol if your health care provider tells you not to drink. ?If you drink alcohol: ?Limit how much you have to 0-2 drinks a day. ?Know how much alcohol is in your drink. In the [place] one drink equals one 12 oz bottle of beer (355 mL), one 5 oz glass of wine (148 mL), or one 1? oz glass of hard liquor (44 mL). ?Lifestyle ?Brush your teeth every morning and night with fluoride toothpaste. Floss one time each day. ?Exercise for at least 30 minutes 5 or more days each week. ?Do not use any products that contain nicotine or tobacco. These products include cigarettes, chewing tobacco, and vaping devices, such as e-cigarettes. If you need help quitting, ask your health care provider. ?Do not use drugs. ?If you are sexually active, practice safe sex. Use a condom or other form of protection to prevent STIs. ?Take aspirin only as told by your health care provider. Make sure that you understand how much to take and what form to take. Work with your health care provider to find out whether it is safe and beneficial for you to take aspirin daily. ?Find healthy ways to manage stress, such as: ?Meditation, yoga, or listening to music. ?Journaling. ?Talking to a trusted person. ?Spending time with friends and family. ?Minimize exposure to UV radiation to reduce your risk of skin cancer. ?Safety ?Always wear your seat belt while driving or riding in a vehicle. ?Do not drive: ?If you have been drinking alcohol. Do not ride with someone who has been drinking. ?When you are tired or distracted. ?While texting. ?If you have been using any mind-altering substances or drugs. ?Wear a helmet and other protective equipment during sports activities. ?If you have firearms in your house, make sure you follow all gun safety procedures. ?What's next? ?Go to your health care provider once a year for an annual wellness visit. ?Ask your health care provider how often you should have your eyes and teeth checked. ?Stay up to date on all  vaccines. ?This information is not intended to replace advice given to you by your health care provider. Make sure you discuss any questions you have with your health care provider. ?Document Revised: 11/30/2020 Document Reviewed: 11/30/2020 ?Elsevier Patient Education ? [place]. ? ?

## 2021-10-06 NOTE — Assessment & Plan Note (Signed)
Possibly related to pelvic floor dysfunction.  We will be placing referral to pelvic floor specialist as below.  We discussed medication options.  We will start Cialis 5 to 10 mg daily as needed.  Discussed potential side effects. ?

## 2021-10-06 NOTE — Assessment & Plan Note (Addendum)
No red flags.  His low abdominal pain is likely related to this as well.  He has a reassuring exam today.  ? ? He has done well with pelvic floor rehab in the past.  We will place referral today. ?

## 2021-10-06 NOTE — Assessment & Plan Note (Signed)
Patient has fear of flying.  He had a visit here about 3 months ago the general provider was prescribed Valium.  He ended up not going on the flight due to his phobia.  We will refer him to see a therapist. ?

## 2021-10-06 NOTE — Progress Notes (Signed)
? ?Chief Complaint:  ?Connor Chapman is a 46 y.o. male who presents today for his annual comprehensive physical exam.   ? ?Assessment/Plan:  ?Chronic Problems Addressed Today: ?Erectile dysfunction ?Possibly related to pelvic floor dysfunction.  We will be placing referral to pelvic floor specialist as below.  We discussed medication options.  We will start Cialis 5 to 10 mg daily as needed.  Discussed potential side effects. ? ?Pelvic floor dysfunction ?No red flags.  His low abdominal pain is likely related to this as well.  He has a reassuring exam today.  ? ? He has done well with pelvic floor rehab in the past.  We will place referral today. ? ?Situational anxiety ?Patient has fear of flying.  He had a visit here about 3 months ago the general provider was prescribed Valium.  He ended up not going on the flight due to his phobia.  We will refer him to see a therapist. ? ?Dyslipidemia ?Check labs.  Discussed lifestyle modifications. ? ?Preventative Healthcare: ?Due for colonoscopy and will be getting next week.  Up-to-date on vaccines.  Check labs. ? ?Patient Counseling(The following topics were reviewed and/or handout was given): ? -Nutrition: Stressed importance of moderation in sodium/caffeine intake, saturated fat and cholesterol, caloric balance, sufficient intake of fresh fruits, vegetables, and fiber. ? -Stressed the importance of regular exercise.  ? -Substance Abuse: Discussed cessation/primary prevention of tobacco, alcohol, or other drug use; driving or other dangerous activities under the influence; availability of treatment for abuse.  ? -Injury prevention: Discussed safety belts, safety helmets, smoke detector, smoking near bedding or upholstery.  ? -Sexuality: Discussed sexually transmitted diseases, partner selection, use of condoms, avoidance of unintended pregnancy and contraceptive alternatives.  ? -Dental health: Discussed importance of regular tooth brushing, flossing, and dental  visits. ? -Health maintenance and immunizations reviewed. Please refer to Health maintenance section. ? ?Return to care in 1 year for next preventative visit.  ? ?  ?Subjective:  ?HPI: ? ?He has no acute complaints today.  ? ?Pain on and off for several weeks in lower abdomen. Comes and goes. Tried OTC medications with some improvement. No nausea or vomiting. No constipation or diarrhea.  ? ?Lifestyle ?Diet: Balanced. Plenty of fruits and vegetables.  ?Exercise: Cardio 4-5 times per wek.  ? ? ?  10/06/2021  ?  8:02 AM  ?Depression screen PHQ 2/9  ?Decreased Interest 0  ?Down, Depressed, Hopeless 0  ?PHQ - 2 Score 0  ? ?ROS: Per HPI, otherwise a complete review of systems was negative.  ? ?PMH: ? ?The following were reviewed and entered/updated in epic: ?Past Medical History:  ?Diagnosis Date  ? Anal fissure   ? Anxiety   ? situational anxiety-not on meds at this time (09/28/2021)  ? Dermatitis, seborrheic   ? Gallstones   ? Hemorrhoids   ? Hyperlipidemia   ? diet controlled  ? ?Patient Active Problem List  ? Diagnosis Date Noted  ? Situational anxiety 10/06/2021  ? Pelvic floor dysfunction 10/06/2021  ? Erectile dysfunction 10/06/2021  ? 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 Penile cyst 05/06/2018  ? H/O cold sores 05/06/2018  ? Seborrheic dermatitis 05/06/2018  ? Dyslipidemia 05/06/2018  ? Chronic pain of both knees 05/06/2018  ? ?Past Surgical History:  ?Procedure Laterality Date  ? CHOLECYSTECTOMY  2004  ? WISDOM TOOTH EXTRACTION    ? ? ?Family History  ?Problem Relation Age of Onset  ? Cancer Mother   ?     Tumor  in knee  ? Arrhythmia Father   ? Pancreatic cancer Paternal Grandfather   ? Hypertension Paternal Grandfather   ? Colon cancer Other   ?     Great Grandfather  ? Colon polyps Neg Hx   ? Esophageal cancer Neg Hx   ? Rectal cancer Neg Hx   ? Stomach cancer Neg Hx   ? ? ?Medications- reviewed and updated ?Current Outpatient Medications  ?Medication Sig Dispense Refill  ? tadalafil (CIALIS) 10 MG  tablet Take 0.5-1 tablets (5-10 mg total) by mouth every other day as needed for erectile dysfunction. 10 tablet 1  ? valACYclovir (VALTREX) 500 MG tablet Take 500 mg by mouth as needed.     ? ?No current facility-administered medications for this visit.  ? ? ?Allergies-reviewed and updated ?Allergies  ?Allergen Reactions  ? Penicillins Swelling  ? ? ?Social History  ? ?Socioeconomic History  ? Marital status: Married  ?  Spouse name: Not on file  ? Number of children: Not on file  ? Years of education: Not on file  ? Highest education level: Not on file  ?Occupational History  ? Not on file  ?Tobacco Use  ? Smoking status: Former  ? Smokeless tobacco: Never  ? Tobacco comments:  ?  Quit in 2002  ?Vaping Use  ? Vaping Use: Never used  ?Substance and Sexual Activity  ? Alcohol use: Yes  ?  Alcohol/week: 4.0 standard drinks  ?  Types: 4 Standard drinks or equivalent per week  ?  Comment: less than 2 drinks daily  ? Drug use: Never  ? Sexual activity: Yes  ?Other Topics Concern  ? Not on file  ?Social History Narrative  ? Not on file  ? ?Social Determinants of Health  ? ?Financial Resource Strain: Not on file  ?Food Insecurity: Not on file  ?Transportation Needs: Not on file  ?Physical Activity: Not on file  ?Stress: Not on file  ?Social Connections: Not on file  ? ?   ?  ?Objective:  ?Physical Exam: ?BP 114/73 (BP Location: Left Arm)   Pulse (!) 55   Temp 98.3 ?F (36.8 ?C) (Temporal)   Ht '6\' 1"'$  (1.854 m)   Wt 205 lb 3.2 oz (93.1 kg)   SpO2 98%   BMI 27.07 kg/m?   ?Body mass index is 27.07 kg/m?. ?Wt Readings from Last 3 Encounters:  ?10/06/21 205 lb 3.2 oz (93.1 kg)  ?09/28/21 206 lb (93.4 kg)  ?06/22/21 203 lb 6.1 oz (92.3 kg)  ? ?Gen: NAD, resting comfortably ?HEENT: TMs normal bilaterally. OP clear. No thyromegaly noted.  ?CV: RRR with no murmurs appreciated ?Pulm: NWOB, CTAB with no crackles, wheezes, or rhonchi ?GI: Normal bowel sounds present. Soft, Nontender, Nondistended.  No hernias appreciated.  ?MSK:  no edema, cyanosis, or clubbing noted ?Skin: warm, dry ?Neuro: CN2-12 grossly intact. Strength 5/5 in upper and lower extremities. Reflexes symmetric and intact bilaterally.  ?Psych: Normal affect and thought content ?   ? ?[name]. Jerline Pain, MD ?10/06/2021 8:29 AM  ?

## 2021-10-10 NOTE — Progress Notes (Signed)
Please inform patient of the following: ? ?His "bad" cholesterol is mildly elevated but stable compared to last year. Everything else is stable. We do not need to make any changes to his treatment plan at this time. He should continue to work on diet and exercise and we can recheck in a year. ? ?Connor Chapman. Jerline Pain, MD ?10/10/2021 8:23 AM  ?

## 2021-10-11 NOTE — Progress Notes (Signed)
His white blood cell counts were just mildly decreased. It is normal to have occasional fluctuation. His levels are typically not a cause for clinical concern since all of his other levels are normal but we can have him come back to check a CBC with differential and peripheral smear for confirmation. Please place future order. ? ?Connor Chapman. Jerline Pain, MD ?10/11/2021 8:44 AM  ?

## 2021-10-12 ENCOUNTER — Other Ambulatory Visit: Payer: Self-pay | Admitting: *Deleted

## 2021-10-12 ENCOUNTER — Encounter: Payer: Self-pay | Admitting: Gastroenterology

## 2021-10-12 ENCOUNTER — Ambulatory Visit (AMBULATORY_SURGERY_CENTER): Payer: 59 | Admitting: Gastroenterology

## 2021-10-12 VITALS — BP 118/75 | HR 76 | Temp 96.8°F | Resp 15 | Ht 73.0 in | Wt 206.0 lb

## 2021-10-12 DIAGNOSIS — D72819 Decreased white blood cell count, unspecified: Secondary | ICD-10-CM

## 2021-10-12 DIAGNOSIS — K635 Polyp of colon: Secondary | ICD-10-CM

## 2021-10-12 DIAGNOSIS — D120 Benign neoplasm of cecum: Secondary | ICD-10-CM

## 2021-10-12 DIAGNOSIS — D124 Benign neoplasm of descending colon: Secondary | ICD-10-CM

## 2021-10-12 DIAGNOSIS — Z1211 Encounter for screening for malignant neoplasm of colon: Secondary | ICD-10-CM

## 2021-10-12 MED ORDER — SODIUM CHLORIDE 0.9 % IV SOLN: 500.0000 mL | INTRAVENOUS | Status: DC

## 2021-10-12 NOTE — Op Note (Signed)
[facility] ?Patient Name: Connor Chapman ?Procedure Date: 10/12/2021 9:06 AM ?MRN: [id number] ?Endoscopist: Carlota Raspberry. Havery Moros , MD ?Age: 46 ?Referring MD:  ?Date of Birth: 1976-03-01 ?Gender: Male ?Account #: [id number] ?Procedure:                Colonoscopy ?Indications:              Screening for colorectal malignant neoplasm, This  ?                          is the patient's first colonoscopy ?Medicines:                Monitored Anesthesia Care ?Procedure:                Pre-Anesthesia Assessment: ?                          - Prior to the procedure, a History and Physical  ?                          was performed, and patient medications and  ?                          allergies were reviewed. The patient's tolerance of  ?                          previous anesthesia was also reviewed. The risks  ?                          and benefits of the procedure and the sedation  ?                          options and risks were discussed with the patient.  ?                          All questions were answered, and informed consent  ?                          was obtained. Prior Anticoagulants: The patient has  ?                          taken no previous anticoagulant or antiplatelet  ?                          agents. ASA Grade Assessment: II - A patient with  ?                          mild systemic disease. After reviewing the risks  ?                          and benefits, the patient was deemed in  ?                          satisfactory condition to undergo the procedure. ?  After obtaining informed consent, the colonoscope  ?                          was passed under direct vision. Throughout the  ?                          procedure, the patient's blood pressure, pulse, and  ?                          oxygen saturations were monitored continuously. The  ?                          Olympus CF-[id number] (Serial# [id number]) Colonoscope was  ?                          introduced through  the anus and advanced to the the  ?                          cecum, identified by appendiceal orifice and  ?                          ileocecal valve. The colonoscopy was performed  ?                          without difficulty. The patient tolerated the  ?                          procedure well. The quality of the bowel  ?                          preparation was good. The ileocecal valve,  ?                          appendiceal orifice, and rectum were photographed. ?Scope In: 9:21:28 AM ?Scope Out: 9:41:33 AM ?Scope Withdrawal Time: 0 hours 16 minutes 53 seconds  ?Total Procedure Duration: 0 hours 20 minutes 5 seconds  ?Findings:                 The perianal and digital rectal examinations were  ?                          normal. ?                          The terminal ileum appeared normal. ?                          A 3 mm polyp was found in the ileocecal valve. The  ?                          polyp was sessile. The polyp was removed with a  ?                          cold snare. Resection and retrieval were complete. ?  A 4 mm polyp was found in the descending colon. The  ?                          polyp was sessile. The polyp was removed with a  ?                          cold snare. Resection and retrieval were complete. ?                          Internal hemorrhoids were found. The hemorrhoids  ?                          were mild. ?                          Anal papilla(e) were hypertrophied. ?                          The exam was otherwise without abnormality. ?Complications:            No immediate complications. Estimated blood loss:  ?                          Minimal. ?Estimated Blood Loss:     Estimated blood loss was minimal. ?Impression:               - The examined portion of the ileum was normal. ?                          - One 3 mm polyp at the ileocecal valve, removed  ?                          with a cold snare. Resected and retrieved. ?                          -  One 4 mm polyp in the descending colon, removed  ?                          with a cold snare. Resected and retrieved. ?                          - Internal hemorrhoids. ?                          - Anal papilla(e) were hypertrophied. ?                          - The examination was otherwise normal. ?Recommendation:           - Patient has a contact number available for  ?                          emergencies. The signs and symptoms of potential  ?                          delayed  complications were discussed with the  ?                          patient. Return to normal activities tomorrow.  ?                          Written discharge instructions were provided to the  ?                          patient. ?                          - Resume previous diet. ?                          - Continue present medications. ?                          - Await pathology results. ?Carlota Raspberry. Havery Moros, MD ?10/12/2021 9:47:54 AM ?This report has been signed electronically. ?

## 2021-10-12 NOTE — Progress Notes (Signed)
Pt's states no medical or surgical changes since previsit or office visit. 

## 2021-10-12 NOTE — Progress Notes (Signed)
Report given to PACU, vss 

## 2021-10-12 NOTE — Progress Notes (Signed)
[facility] History and Physical   Primary Care Physician:  Vivi Barrack, MD   Reason for Procedure:   Colon cancer screening  Plan:    colonoscopy     HPI: Connor Chapman is a 46 y.o. male  here for colonoscopy screening - first time exam. Patient denies any bowel symptoms at this time. No family history of colon cancer known. Otherwise feels well without any cardiopulmonary symptoms.    Past Medical History:  Diagnosis Date   Anal fissure    Anxiety    situational anxiety-not on meds at this time (09/28/2021)   Dermatitis, seborrheic    Gallstones    Hemorrhoids    Hyperlipidemia    diet controlled    Past Surgical History:  Procedure Laterality Date   CHOLECYSTECTOMY  2004   WISDOM TOOTH EXTRACTION      Prior to Admission medications   Medication Sig Start Date End Date Taking? Authorizing Provider  tadalafil (CIALIS) 10 MG tablet Take 0.5-1 tablets (5-10 mg total) by mouth every other day as needed for erectile dysfunction. 10/06/21  Yes Vivi Barrack, MD  valACYclovir (VALTREX) 500 MG tablet Take 500 mg by mouth as needed.     [provider]    Current Outpatient Medications  Medication Sig Dispense Refill   tadalafil (CIALIS) 10 MG tablet Take 0.5-1 tablets (5-10 mg total) by mouth every other day as needed for erectile dysfunction. 10 tablet 1   valACYclovir (VALTREX) 500 MG tablet Take 500 mg by mouth as needed.      Current Facility-Administered Medications  Medication Dose Route Frequency Provider Last Rate Last Admin   0.9 %  sodium chloride infusion  500 mL Intravenous Continuous Rahil Passey, Carlota Raspberry, MD        Allergies as of 10/12/2021 - Review Complete 10/12/2021  Allergen Reaction Noted   Penicillins Swelling 05/06/2018    Family History  Problem Relation Age of Onset   Cancer Mother        Tumor in knee   Arrhythmia Father    Pancreatic cancer Paternal Grandfather    Hypertension Paternal Grandfather    Colon  cancer Other        Great Grandfather   Colon polyps Neg Hx    Esophageal cancer Neg Hx    Rectal cancer Neg Hx    Stomach cancer Neg Hx     Social History   Socioeconomic History   Marital status: Married    Spouse name: Not on file   Number of children: Not on file   Years of education: Not on file   Highest education level: Not on file  Occupational History   Not on file  Tobacco Use   Smoking status: Former   Smokeless tobacco: Never   Tobacco comments:    Quit in 2002  Vaping Use   Vaping Use: Never used  Substance and Sexual Activity   Alcohol use: Yes    Alcohol/week: 4.0 standard drinks    Types: 4 Standard drinks or equivalent per week    Comment: less than 2 drinks daily   Drug use: Never   Sexual activity: Yes  Other Topics Concern   Not on file  Social History Narrative   Not on file   Social Determinants of Health   Financial Resource Strain: Not on file  Food Insecurity: Not on file  Transportation Needs: Not on file  Physical Activity: Not on file  Stress: Not on file  Social Connections:  Not on file  Intimate Partner Violence: Not on file    Review of Systems: All other review of systems negative except as mentioned in the HPI.  Physical Exam: Vital signs BP 135/81   Pulse 63   Temp (!) 96.8 F (36 C) (Temporal)   Resp 16   Ht '6\' 1"'$  (1.854 m)   Wt 206 lb (93.4 kg)   SpO2 100%   BMI 27.18 kg/m   General:   Alert,  Well-developed, pleasant and cooperative in NAD Lungs:  Clear throughout to auscultation.   Heart:  Regular rate and rhythm Abdomen:  Soft, nontender and nondistended.   Neuro/Psych:  Alert and cooperative. Normal mood and affect. A and O x 3  Jolly Mango, MD [facility]

## 2021-10-12 NOTE — Progress Notes (Signed)
Called to room to assist during endoscopic procedure.  Patient ID and intended procedure confirmed with present staff. Received instructions for my participation in the procedure from the performing physician.  

## 2021-10-12 NOTE — Patient Instructions (Signed)
Resume previous diet and medications. °Awaiting pathology results. Repeat Colonoscopy date to be determined based on pathology results. ° °YOU HAD AN ENDOSCOPIC PROCEDURE TODAY AT THE [facility]:   Refer to the procedure report that was given to you for any specific questions about what was found during the examination.  If the procedure report does not answer your questions, please call your gastroenterologist to clarify.  If you requested that your care partner not be given the details of your procedure findings, then the procedure report has been included in a sealed envelope for you to review at your convenience later. ° °YOU SHOULD EXPECT: Some feelings of bloating in the abdomen. Passage of more gas than usual.  Walking can help get rid of the air that was put into your GI tract during the procedure and reduce the bloating. If you had a lower endoscopy (such as a colonoscopy or flexible sigmoidoscopy) you may notice spotting of blood in your stool or on the toilet paper. If you underwent a bowel prep for your procedure, you may not have a normal bowel movement for a few days. ° °Please Note:  You might notice some irritation and congestion in your nose or some drainage.  This is from the oxygen used during your procedure.  There is no need for concern and it should clear up in a day or so. ° °SYMPTOMS TO REPORT IMMEDIATELY: ° °Following lower endoscopy (colonoscopy or flexible sigmoidoscopy): ° Excessive amounts of blood in the stool ° Significant tenderness or worsening of abdominal pains ° Swelling of the abdomen that is new, acute ° Fever of 100°F or higher ° °For urgent or emergent issues, a gastroenterologist can be reached at any hour by calling [phone]. °Do not use MyChart messaging for urgent concerns.  ° ° °DIET:  We do recommend a small meal at first, but then you may proceed to your regular diet.  Drink plenty of fluids but you should avoid alcoholic beverages for 24  hours. ° °ACTIVITY:  You should plan to take it easy for the rest of today and you should NOT DRIVE or use heavy machinery until tomorrow (because of the sedation medicines used during the test).   ° °FOLLOW UP: °Our staff will call the number listed on your records 48-72 hours following your procedure to check on you and address any questions or concerns that you may have regarding the information given to you following your procedure. If we do not reach you, we will leave a message.  We will attempt to reach you two times.  During this call, we will ask if you have developed any symptoms of COVID 19. If you develop any symptoms (ie: fever, flu-like symptoms, shortness of breath, cough etc.) before then, please call [phone].  If you test positive for Covid 19 in the 2 weeks post procedure, please call and report this information to us.   ° °If any biopsies were taken you will be contacted by phone or by letter within the next 1-3 weeks.  Please call us at [phone] if you have not heard about the biopsies in 3 weeks.  ° ° °SIGNATURES/CONFIDENTIALITY: °You and/or your care partner have signed paperwork which will be entered into your electronic medical record.  These signatures attest to the fact that that the information above on your After Visit Summary has been reviewed and is understood.  Full responsibility of the confidentiality of this discharge information lies with you and/or your care-partner.  °

## 2021-10-13 ENCOUNTER — Other Ambulatory Visit: Payer: Self-pay | Admitting: *Deleted

## 2021-10-13 ENCOUNTER — Telehealth: Payer: Self-pay | Admitting: Family Medicine

## 2021-10-13 NOTE — Telephone Encounter (Signed)
Pt is requesting an estimate for the cost of active requests open in his chart.  ? ?Please call back information about estimate or information about how to find the estimate if [facility] cannot provide. ?

## 2021-10-16 ENCOUNTER — Telehealth: Payer: Self-pay | Admitting: *Deleted

## 2021-10-16 NOTE — Telephone Encounter (Signed)
?  Follow up Call- ? ? ?  10/12/2021  ?  8:27 AM  ?Call back number  ?Post procedure Call Back phone  # [phone]  ?Permission to leave phone message Yes  ?  ? ?Patient questions: ? ?Do you have a fever, pain , or abdominal swelling? No. ?Pain Score  0 * ? ?Have you tolerated food without any problems? Yes.   ? ?Have you been able to return to your normal activities? Yes.   ? ?Do you have any questions about your discharge instructions: ?Diet   No. ?Medications  No. ?Follow up visit  No. ? ?Do you have questions or concerns about your Care? No. ? ?Actions: ?* If pain score is 4 or above: ?No action needed, pain <4. ? ? ?

## 2021-10-16 NOTE — Telephone Encounter (Signed)
?  Follow up Call- ? ? ?  10/12/2021  ?  8:27 AM  ?Call back number  ?Post procedure Call Back phone  # [phone]  ?Permission to leave phone message Yes  ?  ? ?Patient questions: ?errror ?

## 2021-10-20 NOTE — Telephone Encounter (Signed)
See below

## 2021-10-23 NOTE — Telephone Encounter (Signed)
CPT codes: ?38177 - [place] differential/platelets ?85060 - Pathology smear review ?Possible shipping and handling via [place]. ? ?$6.68 + $39.00: $45.68 ?+/- $12.00 shipping and handling ? ?Administrator, sports states this is an estimate. ?Pt can call insurance company with given codes to help determine the cost of the full encounter. ?

## 2021-10-30 ENCOUNTER — Other Ambulatory Visit: Payer: 59

## 2021-11-01 ENCOUNTER — Other Ambulatory Visit (INDEPENDENT_AMBULATORY_CARE_PROVIDER_SITE_OTHER): Payer: 59

## 2021-11-01 DIAGNOSIS — D72819 Decreased white blood cell count, unspecified: Secondary | ICD-10-CM | POA: Diagnosis not present

## 2021-11-02 LAB — CBC WITH DIFFERENTIAL/PLATELET
Basophils Absolute: 0 10*3/uL (ref 0.0–0.1)
Basophils Relative: 0.9 % (ref 0.0–3.0)
Eosinophils Absolute: 0.1 10*3/uL (ref 0.0–0.7)
Eosinophils Relative: 1.7 % (ref 0.0–5.0)
HCT: 42.1 % (ref 39.0–52.0)
Hemoglobin: 14.7 g/dL (ref 13.0–17.0)
Lymphocytes Relative: 31.2 % (ref 12.0–46.0)
Lymphs Abs: 1.5 10*3/uL (ref 0.7–4.0)
MCHC: 34.8 g/dL (ref 30.0–36.0)
MCV: 87.4 fl (ref 78.0–100.0)
Monocytes Absolute: 0.5 10*3/uL (ref 0.1–1.0)
Monocytes Relative: 9.7 % (ref 3.0–12.0)
Neutro Abs: 2.7 10*3/uL (ref 1.4–7.7)
Neutrophils Relative %: 56.5 % (ref 43.0–77.0)
Platelets: 199 10*3/uL (ref 150.0–400.0)
RBC: 4.82 Mil/uL (ref 4.22–5.81)
RDW: 12.7 % (ref 11.5–15.5)
WBC: 4.8 10*3/uL (ref 4.0–10.5)

## 2021-11-02 LAB — PATHOLOGIST SMEAR REVIEW

## 2021-11-03 NOTE — Progress Notes (Signed)
Please inform patient of the following:  Blood work is all normal.  Do not need to do any other testing.  We can recheck in a year.

## 2022-03-12 ENCOUNTER — Encounter: Payer: Self-pay | Admitting: *Deleted

## 2022-04-16 ENCOUNTER — Telehealth: Payer: Self-pay | Admitting: Gastroenterology

## 2022-04-16 MED ORDER — HYDROCORTISONE ACETATE 25 MG RE SUPP: 25.0000 mg | Freq: Two times a day (BID) | RECTAL | 0 refills | Status: AC

## 2022-04-16 MED ORDER — CALMOL-4 76-10 % RE SUPP: RECTAL | 0 refills | Status: DC

## 2022-04-16 NOTE — Telephone Encounter (Signed)
Pt returned call. He states that he first noticed the hemorrhoids on Saturday. He had diarrhea Saturday morning. Pt thinks that he has prolapsed internal hemorrhoids. Pt denies any bleeding. Pt does report mostly pain. He has not tried any OTC remedies such as suppositories or rectal creams. Pt states that he tried to manually reduce the hemorrhoids but it was too painful. Pt is scheduled for your next available appt. Please advise, thanks.

## 2022-04-16 NOTE — Telephone Encounter (Signed)
Not sure if he may have thrombosed hemorrhoid versus prolapse.  Lets give him some Anusol suppositories, twice daily to use for the next few days.  He can also use some Calmol 4 suppositories over-the-counter.  He can also use sitz bath's which should help.  You can add him to my clinic this Friday at 1130 if he is willing and available to be seen then.  If severe pain in the interim he would need to go to the ED.  Thanks

## 2022-04-16 NOTE — Telephone Encounter (Signed)
Inbound call from patient stating he has a grade 4 hemorrhoid and wanted to make an appt. Next available was 11/20 with [place]. Patient is requesting a call back for further advise.

## 2022-04-16 NOTE — Telephone Encounter (Signed)
Called and spoke with patient regarding recommendations. Pt would like Anusol RX sent to pharmacy on file, he knows to purchase Calmol 4 suppositories OTC. I instructed patient on sitz baths. Pt is available this week for an appt, pt's has been rescheduled to Friday, 04/20/22 at 11:30 am. Pt knows that if he has severe pain in the interim he will need to go to the ED for evaluation. Pt verbalized understanding and had no concerns at the end of the call.  RX for Anusol sent to pharmacy on file.

## 2022-04-16 NOTE — Telephone Encounter (Signed)
Lm on vm for patient to return call 

## 2022-04-20 ENCOUNTER — Encounter: Payer: Self-pay | Admitting: Gastroenterology

## 2022-04-20 ENCOUNTER — Ambulatory Visit: Payer: 59 | Admitting: Gastroenterology

## 2022-04-20 VITALS — BP 124/70 | HR 66 | Ht 73.0 in | Wt 199.5 lb

## 2022-04-20 DIAGNOSIS — K645 Perianal venous thrombosis: Secondary | ICD-10-CM

## 2022-04-20 DIAGNOSIS — Z8719 Personal history of other diseases of the digestive system: Secondary | ICD-10-CM | POA: Diagnosis not present

## 2022-04-20 MED ORDER — AMBULATORY NON FORMULARY MEDICATION: 0 refills | Status: AC

## 2022-04-20 MED ORDER — CALMOL-4 76-10 % RE SUPP: RECTAL | 0 refills | Status: DC

## 2022-04-20 MED ORDER — CITRUCEL PO POWD
1.0000 | Freq: Every day | ORAL | Status: DC
Start: 2022-04-20 — End: 2022-10-12

## 2022-04-20 NOTE — Patient Instructions (Addendum)
If you are age 45 or older, your body mass index should be between 23-30. Your Body mass index is 26.32 kg/m. If this is out of the aforementioned range listed, please consider follow up with your Primary Care Provider.  If you are age 74 or younger, your body mass index should be between 19-25. Your Body mass index is 26.32 kg/m. If this is out of the aformentioned range listed, please consider follow up with your Primary Care Provider.   ________________________________________________________  Please purchase the following medications over the counter and take as directed: Citrucel  Use Anusol for the next few days.  We have given you samples of the following medication to take: Calmol 4 suppositories  We have sent the following medications to [facility] for you to pick up at your convenience: Diltiazem gel: Use as needed for recurrent fissure   [facility] information is below: Address: [address] [place], [place] [address]  [phone]  *Please DO NOT go directly from our office to pick up this medication! Give the pharmacy 1 day to process the prescription as this is compounded and takes time to make.   How to Take a [employer] A sitz bath is a warm water bath that may be used to care for your rectum, genital area, or the area between your rectum and genitals (perineum). In a sitz bath, the water only comes up to your hips and covers your buttocks. A sitz bath may be done in a bathtub or with a portable sitz bath that fits over the toilet. Your health care provider may recommend a sitz bath to help: Relieve pain and discomfort after delivering a baby. Relieve pain and itching from hemorrhoids or anal fissures. Relieve pain after certain surgeries. Relax muscles that are sore or tight. How to take a sitz bath Take 2-4 sitz baths a day, or as many as told by your health care provider. Bathtub sitz bath To take a sitz bath in a bathtub: Partially  fill a bathtub with warm water. The water should be deep enough to cover your hips and buttocks when you are sitting in the bathtub. Follow your health care provider's instructions if you are told to put medicine in the water. Sit in the water. Open the bathtub drain a little, and leave it open during your bath. Turn on the warm water again, enough to replace the water that is draining out. Keep the water running throughout your bath. This helps keep the water at the right level and temperature. Soak in the water for 15-20 minutes, or as long as told by your health care provider. When you are done, be careful when you stand up. You may feel dizzy. After the sitz bath, pat yourself dry. Do not rub your skin to dry it.  Over-the-toilet sitz bath To take a sitz bath with an over-the-toilet basin: Follow the manufacturer's instructions. Fill the basin with warm water. Follow your health care provider's instructions if you were told to put medicine in the water. Sit on the seat. Make sure the water covers your buttocks and perineum. Soak in the water for 15-20 minutes, or as long as told by your health care provider. After the sitz bath, pat yourself dry. Do not rub your skin to dry it. Clean and dry the basin between uses. Discard the basin if it cracks, or according to the manufacturer's instructions.  Contact a health care provider if: Your pain or itching gets worse. Stop  doing sitz baths if your symptoms get worse. You have new symptoms. Stop doing sitz baths until you talk with your health care provider. Summary A sitz bath is a warm water bath in which the water only comes up to your hips and covers your buttocks. Your health care provider may recommend a sitz bath to help relieve pain and discomfort after delivering a baby, relieve pain and itching from hemorrhoids or anal fissures, relieve pain after certain surgeries, or help to relax muscles that are sore or tight. Take 2-4 sitz baths  a day, or as many as told by your health care provider. Soak in the water for 15-20 minutes. Stop doing sitz baths if your symptoms get worse. This information is not intended to replace advice given to you by your health care provider. Make sure you discuss any questions you have with your health care provider. Document Revised: 09/05/2021 Document Reviewed: 09/05/2021 Elsevier Patient Education  [place] you for entrusting me with your care and for choosing [employer], Dr. [place] Cellar

## 2022-04-20 NOTE — Progress Notes (Signed)
HPI :  46 year old male here for a follow-up visit for rectal pain and bleeding.  Recall I performed his colonoscopy in April of this year, he had 2 small polyps removed, internal hemorrhoids noted that were small and hypertrophied anal papilla.  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  He does use nitroglycerin ointment as needed but ran out a while ago.  He states this past Saturday he had an episode of diarrhea and felt a painful hemorrhoid on the outside of his rectum.  He had some bleeding with this and felt what he thought was prolapse versus thrombosed hemorrhoid that was quite tender to the touch.  He called into the office, we prescribed him some Anusol to use any states that has certainly helped.  He has not had really much further bleeding, very mild, the pain is better but not resolved.  He states this felt much different from typical anal fissure symptoms.  He does not have any hard stools or diarrhea on a routine basis, has occasional loose stool if he drinks coffee etc.  He does endorse straining and sitting on the toilet for a long time however.  He is not using anything in particular for his bowels at this time, tries to eat healthy diet.  He has not tried sitz bath's or other therapies thus far.  Overall better than when he first contacted [place] but has not resolved.    Colonoscopy 10/12/21: - The perianal and digital rectal examinations were normal. - The terminal ileum appeared normal. - A 3 mm polyp was found in the ileocecal valve. The polyp was sessile. The polyp was removed with a cold snare. Resection and retrieval were complete. - A 4 mm polyp was found in the descending colon. The polyp was sessile. The polyp was removed with a cold snare. Resection and retrieval were complete. - Internal hemorrhoids were found. The hemorrhoids were mild. - Anal papilla(e) were hypertrophied. - The exam was otherwise without abnormality.  1. Surgical [P], small  bowel, ileocecal valve, polyp (1) BENIGN COLONIC MUCOSA WITH LYMPHOID AGGREGATE 2. Surgical [P], colon, descending, polyp (1) HYPERPLASTIC POLYP NEGATIVE FOR DYSPLASIA AND CARCINOMA  Past Medical History:  Diagnosis Date   Anal fissure    Anxiety    situational anxiety-not on meds at this time (09/28/2021)   Dermatitis, seborrheic    Gallstones    Hemorrhoids    Hyperlipidemia    diet controlled     Past Surgical History:  Procedure Laterality Date   CHOLECYSTECTOMY  2004   WISDOM TOOTH EXTRACTION     Family History  Problem Relation Age of Onset   Cancer Mother        Tumor in knee   Arrhythmia Father    Pancreatic cancer Paternal Grandfather    Hypertension Paternal Grandfather    Colon cancer Other        Great Grandfather   Colon polyps Neg Hx    Esophageal cancer Neg Hx    Rectal cancer Neg Hx    Stomach cancer Neg Hx    Social History   Tobacco Use   Smoking status: Former   Smokeless tobacco: Never   Tobacco comments:    Quit in 2002  Vaping Use   Vaping Use: Never used  Substance Use Topics   Alcohol use: Yes    Alcohol/week: 4.0 standard drinks of alcohol    Types: 4 Standard drinks or equivalent per week    Comment:  less than 2 drinks daily   Drug use: Never   Current Outpatient Medications  Medication Sig Dispense Refill   hydrocortisone (ANUSOL-HC) 25 MG suppository Place 1 suppository (25 mg total) rectally 2 (two) times daily. 12 suppository 0   Rectal Protectant-Emollient (CALMOL-4) 76-10 % SUPP Place 1 suppository rectally after each bowel movement, up to 6 times a day 20 suppository 0   tadalafil (CIALIS) 10 MG tablet Take 0.5-1 tablets (5-10 mg total) by mouth every other day as needed for erectile dysfunction. 10 tablet 1   valACYclovir (VALTREX) 500 MG tablet Take 500 mg by mouth as needed.      No current facility-administered medications for this visit.   Allergies  Allergen Reactions   Penicillins Swelling     Review of  Systems: All systems reviewed and negative except where noted in HPI.      Physical Exam: BP 124/70   Pulse 66   Ht '6\' 1"'$  (1.854 m)   Wt 199 lb 8 oz (90.5 kg)   BMI 26.32 kg/m  Constitutional: Pleasant,well-developed, male in no acute distress. Perianal exam - CMA Tia Alert as standby -healing thrombosed hemorrhoid in the right lateral external area.  Some mild scarring of posterior midline anal canal without active fissure.  Full DRE not performed due to thrombosed hemorrhoid. Neurological: Alert and oriented to person place and time. Skin: Skin is warm and dry. No rashes noted. Psychiatric: Normal mood and affect. Behavior is normal.   ASSESSMENT: 46 y.o. male here for assessment of the following  1. Thrombosed external hemorrhoid   2. History of anal fissures    Patient with symptoms of a thrombosed external hemorrhoid recently.  With Anusol this has improved over recent days but not resolved.  At baseline he does not have much symptoms from hemorrhoids that bother him.  He does endorse some baseline straining/long time spent on the toilet with a history of intermittent anal fissures, he has responded well to nitroglycerin ointment in the past but that has caused some headache.  He is definitely improved with conservative measures and would continue Anusol for the next few days.  Would not recommend surgery / lancing of it at this time, he is out of time range for this.  Also recommended Sitz bath's for this and handouts provided.  At baseline I think he should take fiber supplement daily to minimize his straining and time spent on the toilet.  Recommend Citrucel in this light.  Also provided some samples of Calmol 4 suppositories as needed for irritation, he can use over-the-counter as needed.  For his recurrent anal fissure, provided a prescription for diltiazem/lidocaine ointment at the compounding pharmacy to use as needed, especially if nitroglycerin is causing headache.   Hopefully with using Citrucel daily this will minimize his risk for recurrent symptoms.  PLAN: - Citrucel daily - minimize straining, time spent on toilet - use Anusol PR for the next few days - Sitz baths for next few days, handouts given - samples of Calmol4 given PRN - diltiazem / lidocaine ointment PRN for recurrent fissure - no banding for now, follow up PRN for recurrent symptoms  Jolly Mango, MD [facility]

## 2022-05-07 ENCOUNTER — Ambulatory Visit: Payer: 59 | Admitting: Gastroenterology

## 2022-05-31 ENCOUNTER — Encounter: Payer: Self-pay | Admitting: *Deleted

## 2022-06-15 ENCOUNTER — Other Ambulatory Visit: Payer: Self-pay | Admitting: Family Medicine

## 2022-08-16 ENCOUNTER — Telehealth: Payer: Self-pay | Admitting: Family Medicine

## 2022-08-16 NOTE — Telephone Encounter (Signed)
Last OV: 10/06/21 Next OV: 10/12/22 Med: valACYclovir (VALTREX) 500 MG tablet  Pharmacy: [facility] @ [address]

## 2022-08-17 NOTE — Telephone Encounter (Signed)
Patient need OV last for evaluation  Last Rx done by historical provider in 2019

## 2022-08-20 NOTE — Telephone Encounter (Signed)
Vml for pt to call back and sch

## 2022-10-12 ENCOUNTER — Encounter: Payer: 59 | Admitting: Family Medicine

## 2022-10-12 ENCOUNTER — Ambulatory Visit (INDEPENDENT_AMBULATORY_CARE_PROVIDER_SITE_OTHER): Payer: 59 | Admitting: Family Medicine

## 2022-10-12 VITALS — BP 120/65 | HR 52 | Temp 97.5°F | Ht 73.0 in | Wt 203.0 lb

## 2022-10-12 DIAGNOSIS — R739 Hyperglycemia, unspecified: Secondary | ICD-10-CM

## 2022-10-12 DIAGNOSIS — N529 Male erectile dysfunction, unspecified: Secondary | ICD-10-CM | POA: Diagnosis not present

## 2022-10-12 DIAGNOSIS — E785 Hyperlipidemia, unspecified: Secondary | ICD-10-CM | POA: Diagnosis not present

## 2022-10-12 DIAGNOSIS — L219 Seborrheic dermatitis, unspecified: Secondary | ICD-10-CM

## 2022-10-12 DIAGNOSIS — Z0001 Encounter for general adult medical examination with abnormal findings: Secondary | ICD-10-CM | POA: Diagnosis not present

## 2022-10-12 LAB — CBC
HCT: 44.5 % (ref 39.0–52.0)
Hemoglobin: 15.2 g/dL (ref 13.0–17.0)
MCHC: 34.3 g/dL (ref 30.0–36.0)
MCV: 86.6 fl (ref 78.0–100.0)
Platelets: 206 10*3/uL (ref 150.0–400.0)
RBC: 5.13 Mil/uL (ref 4.22–5.81)
RDW: 12.9 % (ref 11.5–15.5)
WBC: 3.7 10*3/uL — ABNORMAL LOW (ref 4.0–10.5)

## 2022-10-12 LAB — COMPREHENSIVE METABOLIC PANEL
ALT: 13 U/L (ref 0–53)
AST: 16 U/L (ref 0–37)
Albumin: 4.2 g/dL (ref 3.5–5.2)
Alkaline Phosphatase: 55 U/L (ref 39–117)
BUN: 15 mg/dL (ref 6–23)
CO2: 26 mEq/L (ref 19–32)
Calcium: 8.9 mg/dL (ref 8.4–10.5)
Chloride: 105 mEq/L (ref 96–112)
Creatinine, Ser: 1.02 mg/dL (ref 0.40–1.50)
GFR: 88.3 mL/min (ref >60.00)
Glucose, Bld: 91 mg/dL (ref 70–99)
Potassium: 4.4 mEq/L (ref 3.5–5.1)
Sodium: 139 mEq/L (ref 135–145)
Total Bilirubin: 0.9 mg/dL (ref 0.2–1.2)
Total Protein: 6.5 g/dL (ref 6.0–8.3)

## 2022-10-12 LAB — LIPID PANEL
Cholesterol: 200 mg/dL (ref 0–200)
HDL: 58.2 mg/dL (ref >39.00)
LDL Cholesterol: 131 mg/dL — ABNORMAL HIGH (ref 0–99)
NonHDL: 141.39
Total CHOL/HDL Ratio: 3
Triglycerides: 54 mg/dL (ref 0.0–149.0)
VLDL: 10.8 mg/dL (ref 0.0–40.0)

## 2022-10-12 LAB — TSH: TSH: 1.42 u[IU]/mL (ref 0.35–5.50)

## 2022-10-12 LAB — HEMOGLOBIN A1C: Hgb A1c MFr Bld: 5.4 % (ref 4.6–6.5)

## 2022-10-12 MED ORDER — KETOCONAZOLE 2 % EX CREA
1.0000 | TOPICAL_CREAM | Freq: Two times a day (BID) | CUTANEOUS | 5 refills | Status: AC
Start: 2022-10-12 — End: ?

## 2022-10-12 NOTE — Assessment & Plan Note (Signed)
Check lipids.  He is working on lifestyle modifications. 

## 2022-10-12 NOTE — Assessment & Plan Note (Signed)
Overall stable.  Needs refill on ketoconazole today.  Will send in.

## 2022-10-12 NOTE — Progress Notes (Signed)
His LDL is up just a little bit since last year but all the other labs are stable.  Do not need to make any changes to treatment plan at this time.  He should continue to work on diet and exercise and we can recheck everything in a year or so.

## 2022-10-12 NOTE — Assessment & Plan Note (Signed)
Stable on Cialis 5 to 10 mg daily as needed.  Does not need refill today.  No significant side effects.

## 2022-10-12 NOTE — Patient Instructions (Addendum)
It was very nice to see you today!  We will check blood work today.  Please continue to work on diet and exercise.  I will refill your ketoconazole today.  Return in about 1 year (around 10/12/2023) for Annual Physical.   Take care, Dr Jimmey Ralph  PLEASE NOTE:  If you had any lab tests, please let us know if you have not heard back within a few days. You may see your results on mychart before we have a chance to review them but we will give you a call once they are reviewed by [place].   If we ordered any referrals today, please let us know if you have not heard from their office within the next week.   If you had any urgent prescriptions sent in today, please check with the pharmacy within an hour of our visit to make sure the prescription was transmitted appropriately.   Please try these tips to maintain a healthy lifestyle:  Eat at least 3 REAL meals and 1-2 snacks per day.  Aim for no more than 5 hours between eating.  If you eat breakfast, please do so within one hour of getting up.   Each meal should contain half fruits/vegetables, one quarter protein, and one quarter carbs (no bigger than a computer mouse)  Cut down on sweet beverages. This includes juice, soda, and sweet tea.   Drink at least 1 glass of water with each meal and aim for at least 8 glasses per day  Exercise at least 150 minutes every week.    Preventive Care 46-45 Years Old, Male Preventive care refers to lifestyle choices and visits with your health care provider that can promote health and wellness. Preventive care visits are also called wellness exams. What can I expect for my preventive care visit? Counseling During your preventive care visit, your health care provider may ask about your: Medical history, including: Past medical problems. Family medical history. Current health, including: Emotional well-being. Home life and relationship well-being. Sexual activity. Lifestyle, including: Alcohol, nicotine  or tobacco, and drug use. Access to firearms. Diet, exercise, and sleep habits. Safety issues such as seatbelt and bike helmet use. Sunscreen use. Work and work Astronomer. Physical exam Your health care provider will check your: Height and weight. These may be used to calculate your BMI (body mass index). BMI is a measurement that tells if you are at a healthy weight. Waist circumference. This measures the distance around your waistline. This measurement also tells if you are at a healthy weight and may help predict your risk of certain diseases, such as type 2 diabetes and high blood pressure. Heart rate and blood pressure. Body temperature. Skin for abnormal spots. What immunizations do I need?  Vaccines are usually given at various ages, according to a schedule. Your health care provider will recommend vaccines for you based on your age, medical history, and lifestyle or other factors, such as travel or where you work. What tests do I need? Screening Your health care provider may recommend screening tests for certain conditions. This may include: Lipid and cholesterol levels. Diabetes screening. This is done by checking your blood sugar (glucose) after you have not eaten for a while (fasting). Hepatitis B test. Hepatitis C test. HIV (human immunodeficiency virus) test. STI (sexually transmitted infection) testing, if you are at risk. Lung cancer screening. Prostate cancer screening. Colorectal cancer screening. Talk with your health care provider about your test results, treatment options, and if necessary, the need for more tests.  Follow these instructions at home: Eating and drinking  Eat a diet that includes fresh fruits and vegetables, whole grains, lean protein, and low-fat dairy products. Take vitamin and mineral supplements as recommended by your health care provider. Do not drink alcohol if your health care provider tells you not to drink. If you drink alcohol: Limit  how much you have to 0-2 drinks a day. Know how much alcohol is in your drink. In the [place] one drink equals one 12 oz bottle of beer (355 mL), one 5 oz glass of wine (148 mL), or one 1 oz glass of hard liquor (44 mL). Lifestyle Brush your teeth every morning and night with fluoride toothpaste. Floss one time each day. Exercise for at least 30 minutes 5 or more days each week. Do not use any products that contain nicotine or tobacco. These products include cigarettes, chewing tobacco, and vaping devices, such as e-cigarettes. If you need help quitting, ask your health care provider. Do not use drugs. If you are sexually active, practice safe sex. Use a condom or other form of protection to prevent STIs. Take aspirin only as told by your health care provider. Make sure that you understand how much to take and what form to take. Work with your health care provider to find out whether it is safe and beneficial for you to take aspirin daily. Find healthy ways to manage stress, such as: Meditation, yoga, or listening to music. Journaling. Talking to a trusted person. Spending time with friends and family. Minimize exposure to UV radiation to reduce your risk of skin cancer. Safety Always wear your seat belt while driving or riding in a vehicle. Do not drive: If you have been drinking alcohol. Do not ride with someone who has been drinking. When you are tired or distracted. While texting. If you have been using any mind-altering substances or drugs. Wear a helmet and other protective equipment during sports activities. If you have firearms in your house, make sure you follow all gun safety procedures. What's next? Go to your health care provider once a year for an annual wellness visit. Ask your health care provider how often you should have your eyes and teeth checked. Stay up to date on all vaccines. This information is not intended to replace advice given to you by your health care  provider. Make sure you discuss any questions you have with your health care provider. Document Revised: 11/30/2020 Document Reviewed: 11/30/2020 Elsevier Patient Education  2023 [employer].

## 2022-10-12 NOTE — Progress Notes (Signed)
Chief Complaint:  Connor Chapman is a 47 y.o. male who presents today for his annual comprehensive physical exam.    Assessment/Plan:  Chronic Problems Addressed Today: Seborrheic dermatitis Overall stable.  Needs refill on ketoconazole today.  Will send in.  Erectile dysfunction Stable on Cialis 5 to 10 mg daily as needed.  Does not need refill today.  No significant side effects.  Dyslipidemia Check lipids.  He is working on lifestyle modifications.  Preventative Healthcare: Check labs.  Had colonoscopy last year.  Up-to-date on vaccines.  Patient Counseling(The following topics were reviewed and/or handout was given):  -Nutrition: Stressed importance of moderation in sodium/caffeine intake, saturated fat and cholesterol, caloric balance, sufficient intake of fresh fruits, vegetables, and fiber.  -Stressed the importance of regular exercise.   -Substance Abuse: Discussed cessation/primary prevention of tobacco, alcohol, or other drug use; driving or other dangerous activities under the influence; availability of treatment for abuse.   -Injury prevention: Discussed safety belts, safety helmets, smoke detector, smoking near bedding or upholstery.   -Sexuality: Discussed sexually transmitted diseases, partner selection, use of condoms, avoidance of unintended pregnancy and contraceptive alternatives.   -Dental health: Discussed importance of regular tooth brushing, flossing, and dental visits.  -Health maintenance and immunizations reviewed. Please refer to Health maintenance section.  Return to care in 1 year for next preventative visit.     Subjective:  HPI:  He has no acute complaints today. See A/p for status of chronic conditions.   Lifestyle Diet: Balanced. Plenty of fruits and vegetables. Cut out alcohol.  Exercise: Running and weight training.      10/12/2022    7:47 AM  Depression screen PHQ 2/9  Decreased Interest 0  Down, Depressed, Hopeless 0  PHQ - 2  Score 0  Altered sleeping 0  Tired, decreased energy 0  Change in appetite 0  Feeling bad or failure about yourself  0  Trouble concentrating 0  Moving slowly or fidgety/restless 0  Suicidal thoughts 0  PHQ-9 Score 0  Difficult doing work/chores Not difficult at all    There are no preventive care reminders to display for this patient.   ROS: Per HPI, otherwise a complete review of systems was negative.   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   Penile cyst 05/06/2018   H/O cold sores 05/06/2018   Seborrheic dermatitis 05/06/2018   Dyslipidemia 05/06/2018   Chronic pain of both knees 05/06/2018   Past Surgical History:  Procedure Laterality Date   CHOLECYSTECTOMY  2004   WISDOM TOOTH EXTRACTION      Family History  Problem Relation Age of Onset   Cancer Mother        Tumor in knee   Arrhythmia Father    Pancreatic cancer Paternal Grandfather    Hypertension Paternal Grandfather    Colon cancer Other        Great Grandfather   Colon polyps Neg Hx    Esophageal cancer Neg Hx    Rectal cancer Neg Hx  Stomach cancer Neg Hx     Medications- reviewed and updated Current Outpatient Medications  Medication Sig Dispense Refill   AMBULATORY NON FORMULARY MEDICATION Diltiazem gel with 2% lidocaine  Apply a pea sized amount into your rectum three times daily as needed Dispense 30 GM zero refill 30 g 0   hydrocortisone (ANUSOL-HC) 25 MG suppository Place 1 suppository (25 mg total) rectally 2 (two) times daily. 12 suppository  0   ketoconazole (NIZORAL) 2 % cream Apply 1 Application topically 2 (two) times daily. 60 g 5   Rectal Protectant-Emollient (CALMOL-4) 76-10 % SUPP Use as needed 6 suppository 0   tadalafil (CIALIS) 10 MG tablet TAKE 0.5-1 TABLET BY MOUTH EVERY OTHER DAY AS NEEDED FOR ERECTILE DYSFUNCTION 10 tablet 1   valACYclovir (VALTREX) 500 MG tablet Take 500 mg by mouth as needed.      No current facility-administered medications for this visit.    Allergies-reviewed and updated Allergies  Allergen Reactions   Penicillins Swelling    Social History   Socioeconomic History   Marital status: Married    Spouse name: Not on file   Number of children: Not on file   Years of education: Not on file   Highest education level: Not on file  Occupational History   Not on file  Tobacco Use   Smoking status: Former   Smokeless tobacco: Never   Tobacco comments:    Quit in 2002  Vaping Use   Vaping Use: Never used  Substance and Sexual Activity   Alcohol use: Yes    Alcohol/week: 4.0 standard drinks of alcohol    Types: 4 Standard drinks or equivalent per week    Comment: less than 2 drinks daily   Drug use: Never   Sexual activity: Yes  Other Topics Concern   Not on file  Social History Narrative   Not on file   Social Determinants of Health   Financial Resource Strain: Not on file  Food Insecurity: Not on file  Transportation Needs: Not on file  Physical Activity: Not on file  Stress: Not on file  Social Connections: Not on file        Objective:  Physical Exam: BP 120/65 (BP Location: Left Arm)   Pulse (!) 52   Temp (!) 97.5 F (36.4 C) (Temporal)   Ht 6\' 1"  (1.854 m)   Wt 203 lb (92.1 kg)   SpO2 98%   BMI 26.78 kg/m   Body mass index is 26.78 kg/m. Wt Readings from Last 3 Encounters:  10/12/22 203 lb (92.1 kg)  04/20/22 199 lb 8 oz (90.5 kg)  10/12/21 206 lb (93.4 kg)   Gen: NAD, resting comfortably HEENT: TMs normal bilaterally. OP clear. No thyromegaly noted.   CV: RRR with no murmurs appreciated Pulm: NWOB, CTAB with no crackles, wheezes, or rhonchi GI: Normal bowel sounds present. Soft, Nontender, Nondistended. MSK: no edema, cyanosis, or clubbing noted Skin: warm, dry.  Seborrheic dermatitis scattered around facial hair infranasally Neuro: CN2-12 grossly intact. Strength 5/5 in upper and lower extremities. Reflexes symmetric and intact bilaterally.  Psych: Normal affect and thought content     Micala Saltsman M. Jimmey Ralph, MD 10/12/2022 8:03 AM

## 2022-11-22 ENCOUNTER — Telehealth: Payer: Self-pay | Admitting: Family Medicine

## 2022-11-22 NOTE — Telephone Encounter (Signed)
Prescription Request  11/22/2022  LOV: 10/12/2022  What is the name of the medication or equipment? valACYclovir (VALTREX) 500 MG tablet   Have you contacted your pharmacy to request a refill? Yes   Which pharmacy would you like this sent to?  [facility] [id number] [place], [place] - [address] [place] [address] Phone: [phone] Fax: [phone]    Patient notified that their request is being sent to the clinical staff for review and that they should receive a response within 2 business days.   Please advise at Mobile [phone] (mobile)

## 2022-11-22 NOTE — Telephone Encounter (Signed)
Last refill by historical provider  

## 2022-11-23 ENCOUNTER — Other Ambulatory Visit: Payer: Self-pay | Admitting: *Deleted

## 2022-11-23 MED ORDER — VALACYCLOVIR HCL 500 MG PO TABS: 500.0000 mg | ORAL_TABLET | Freq: Two times a day (BID) | ORAL | 3 refills | Status: AC

## 2022-11-23 NOTE — Telephone Encounter (Signed)
Rx send to pharmacy  

## 2022-11-23 NOTE — Telephone Encounter (Signed)
Ok to refill valtrex 500 mg twice daily for 3 days as needed for outbreaks dispense 30 with 3 refills.  Katina Degree. Jimmey Ralph, MD 11/23/2022 7:44 AM

## 2023-04-01 ENCOUNTER — Other Ambulatory Visit: Payer: Self-pay | Admitting: Family Medicine

## 2023-08-15 ENCOUNTER — Other Ambulatory Visit: Payer: Self-pay | Admitting: Family Medicine

## 2023-08-15 MED ORDER — TADALAFIL 10 MG PO TABS: 5.0000 mg | ORAL_TABLET | Freq: Every day | ORAL | 1 refills | Status: DC | PRN

## 2023-08-15 NOTE — Telephone Encounter (Signed)
 Copied from CRM [id number]. Topic: Clinical - Medication Refill >> Aug 15, 2023 11:53 AM Clayton Bibles wrote: Most Recent Primary Care Visit:  Provider: Ardith Dark  Department: [facility]  Visit Type: PHYSICAL  Date: 10/12/2022  Medication: tadalafil (CIALIS) 10 MG tablet  Has the patient contacted their pharmacy? No (Agent: If no, request that the patient contact the pharmacy for the refill. If patient does not wish to contact the pharmacy document the reason why and proceed with request.) (Agent: If yes, when and what did the pharmacy advise?)  Is this the correct pharmacy for this prescription? Yes Karin Golden If no, delete pharmacy and type the correct one.  This is the patient's preferred pharmacy:  [facility] [id number] [place], [place] - [address] [address] Phone: [phone] Fax: [phone]   Has the prescription been filled recently? No  Is the patient out of the medication? Yes  Has the patient been seen for an appointment in the last year OR does the patient have an upcoming appointment? Yes  Can we respond through MyChart? Yes  Agent: Please be advised that Rx refills may take up to 3 business days. We ask that you follow-up with your pharmacy.

## 2023-08-15 NOTE — Telephone Encounter (Signed)
 10/12/2022 LOV  04/01/2023 fill date   10/1 refill

## 2024-04-14 ENCOUNTER — Ambulatory Visit: Admitting: Family Medicine

## 2024-04-14 ENCOUNTER — Encounter: Payer: Self-pay | Admitting: Family Medicine

## 2024-04-14 VITALS — BP 110/78 | HR 53 | Temp 97.2°F | Ht 73.0 in | Wt 210.2 lb

## 2024-04-14 DIAGNOSIS — Z0001 Encounter for general adult medical examination with abnormal findings: Secondary | ICD-10-CM

## 2024-04-14 DIAGNOSIS — Z23 Encounter for immunization: Secondary | ICD-10-CM | POA: Diagnosis not present

## 2024-04-14 DIAGNOSIS — I8390 Asymptomatic varicose veins of unspecified lower extremity: Secondary | ICD-10-CM

## 2024-04-14 DIAGNOSIS — E785 Hyperlipidemia, unspecified: Secondary | ICD-10-CM

## 2024-04-14 DIAGNOSIS — Z131 Encounter for screening for diabetes mellitus: Secondary | ICD-10-CM | POA: Diagnosis not present

## 2024-04-14 DIAGNOSIS — Z125 Encounter for screening for malignant neoplasm of prostate: Secondary | ICD-10-CM

## 2024-04-14 DIAGNOSIS — Z13220 Encounter for screening for lipoid disorders: Secondary | ICD-10-CM

## 2024-04-14 DIAGNOSIS — M25562 Pain in left knee: Secondary | ICD-10-CM | POA: Diagnosis not present

## 2024-04-14 DIAGNOSIS — G4700 Insomnia, unspecified: Secondary | ICD-10-CM

## 2024-04-14 DIAGNOSIS — N529 Male erectile dysfunction, unspecified: Secondary | ICD-10-CM | POA: Diagnosis not present

## 2024-04-14 DIAGNOSIS — M25561 Pain in right knee: Secondary | ICD-10-CM

## 2024-04-14 DIAGNOSIS — G8929 Other chronic pain: Secondary | ICD-10-CM | POA: Diagnosis not present

## 2024-04-14 LAB — LIPID PANEL
Cholesterol: 222 mg/dL — ABNORMAL HIGH (ref 0–200)
HDL: 59.3 mg/dL (ref >39.00)
LDL Cholesterol: 148 mg/dL — ABNORMAL HIGH (ref 0–99)
NonHDL: 162.63
Total CHOL/HDL Ratio: 4
Triglycerides: 74 mg/dL (ref 0.0–149.0)
VLDL: 14.8 mg/dL (ref 0.0–40.0)

## 2024-04-14 LAB — TSH: TSH: 1.55 u[IU]/mL (ref 0.35–5.50)

## 2024-04-14 LAB — VITAMIN D 25 HYDROXY (VIT D DEFICIENCY, FRACTURES): VITD: 19.1 ng/mL — ABNORMAL LOW (ref 30.00–100.00)

## 2024-04-14 LAB — COMPREHENSIVE METABOLIC PANEL WITH GFR
ALT: 13 U/L (ref 0–53)
AST: 14 U/L (ref 0–37)
Albumin: 4.2 g/dL (ref 3.5–5.2)
Alkaline Phosphatase: 52 U/L (ref 39–117)
BUN: 17 mg/dL (ref 6–23)
CO2: 28 meq/L (ref 19–32)
Calcium: 8.4 mg/dL (ref 8.4–10.5)
Chloride: 106 meq/L (ref 96–112)
Creatinine, Ser: 0.83 mg/dL (ref 0.40–1.50)
GFR: 104.04 mL/min (ref >60.00)
Glucose, Bld: 91 mg/dL (ref 70–99)
Potassium: 4.4 meq/L (ref 3.5–5.1)
Sodium: 140 meq/L (ref 135–145)
Total Bilirubin: 0.7 mg/dL (ref 0.2–1.2)
Total Protein: 6.2 g/dL (ref 6.0–8.3)

## 2024-04-14 LAB — TESTOSTERONE: Testosterone: 516.68 ng/dL (ref 300.00–890.00)

## 2024-04-14 LAB — PSA: PSA: 0.56 ng/mL (ref 0.10–4.00)

## 2024-04-14 LAB — CBC
HCT: 44.6 % (ref 39.0–52.0)
Hemoglobin: 15.1 g/dL (ref 13.0–17.0)
MCHC: 33.8 g/dL (ref 30.0–36.0)
MCV: 86.7 fl (ref 78.0–100.0)
Platelets: 198 K/uL (ref 150.0–400.0)
RBC: 5.14 Mil/uL (ref 4.22–5.81)
RDW: 12.9 % (ref 11.5–15.5)
WBC: 3.5 K/uL — ABNORMAL LOW (ref 4.0–10.5)

## 2024-04-14 LAB — HEMOGLOBIN A1C: Hgb A1c MFr Bld: 5.6 % (ref 4.6–6.5)

## 2024-04-14 LAB — MAGNESIUM: Magnesium: 1.9 mg/dL (ref 1.5–2.5)

## 2024-04-14 LAB — VITAMIN B12: Vitamin B-12: 210 pg/mL — ABNORMAL LOW (ref 211–911)

## 2024-04-14 MED ORDER — TADALAFIL 20 MG PO TABS: 10.0000 mg | ORAL_TABLET | ORAL | 11 refills | Status: AC | PRN

## 2024-04-14 NOTE — Progress Notes (Signed)
 Chief Complaint:  Connor Chapman is a 48 y.o. male who presents today for his annual comprehensive physical exam.    Assessment/Plan:  Chronic Problems Addressed Today: Erectile dysfunction On Cialis  5 to 10 mg daily as needed though thinks it could be a little bit stronger.  Will increase to 20 mg daily.  Discussed side effects.  Check testosterone labs.  He will follow-up with us  in a few weeks on MyChart.  Dyslipidemia Check lipids.  Discussed lifestyle modifications.  Insomnia Check labs today.  Discussed sleep hygiene measures.  He is not interested in medications for this at this point.  Can continue using melatonin as needed.  Chronic pain of both knees Likely underlying osteoarthritis.  We discussed conservative management including occasional ice, compression, and over-the-counter analgesics such as ibuprofen and Tylenol as needed.  He will let us  know if symptoms are not controlled with this and would consider imaging versus referral to sports medicine at that time.  Varicose vein of leg Patient with varicosities noted in bilateral lower extremities.  Has a few scattered hemosiderin deposits as well.  Reassured patient this is a cosmetic issue.  Not currently having any other symptoms.  We did discuss use of compression stockings and leg elevation.  He will let us  know if he needs referral to see dermatology.  Preventative Healthcare: Tdap given today.  Check labs.  Discussed skin cancer screening and referral to dermatology however he declined for now.  He will let us  know if he has any concerning lesions appear.  Patient Counseling(The following topics were reviewed and/or handout was given):  -Nutrition: Stressed importance of moderation in sodium/caffeine intake, saturated fat and cholesterol, caloric balance, sufficient intake of fresh fruits, vegetables, and fiber.  -Stressed the importance of regular exercise.   -Substance Abuse: Discussed cessation/primary  prevention of tobacco, alcohol, or other drug use; driving or other dangerous activities under the influence; availability of treatment for abuse.   -Injury prevention: Discussed safety belts, safety helmets, smoke detector, smoking near bedding or upholstery.   -Sexuality: Discussed sexually transmitted diseases, partner selection, use of condoms, avoidance of unintended pregnancy and contraceptive alternatives.   -Dental health: Discussed importance of regular tooth brushing, flossing, and dental visits.  -Health maintenance and immunizations reviewed. Please refer to Health maintenance section.  Return to care in 1 year for next preventative visit.     Subjective:  HPI:  He has no acute complaints today. Patient is here today for his annual physical.  See assessment / plan for status of chronic conditions.  Discussed the use of AI scribe software for clinical note transcription with the patient, who gave verbal consent to proceed.  History of Present Illness Connor Chapman is a 48 year old male who presents for an annual physical exam and tetanus shot.  He is interested in checking his testosterone levels due to concerns about energy levels and erectile dysfunction. His current dose of Cialis  is effective but feels it may not be as potent as before. He has been breaking his current dose of Cialis  in half.  He experiences difficulty sleeping, characterized by trouble falling asleep and waking up around 2 or 3 AM. He limits caffeine intake to the mornings and has tried melatonin with some success. He is interested in checking his magnesium levels as his wife suggested it might be related to his sleep issues.  He has a history of hereditary high cholesterol and is mindful of his diet and exercise, engaging in weightlifting  and some cardio. He is curious about his cholesterol levels in the upcoming blood work.  He has noticed marks on his shins, which he describes as iron deposits, and has  varicose veins that do not bother him. He also has several moles on his back.  He experiences knee pain and is unsure when to address it. He manages it with weight-bearing exercises and occasional use of ibuprofen or Tylenol.      04/14/2024    7:26 AM  Depression screen PHQ 2/9  Decreased Interest 0  Down, Depressed, Hopeless 0  PHQ - 2 Score 0    There are no preventive care reminders to display for this patient.   ROS: Per HPI, otherwise a complete review of systems was negative.   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   Penile cyst 05/06/2018   H/O cold sores 05/06/2018   Seborrheic dermatitis 05/06/2018   Dyslipidemia 05/06/2018   Chronic pain of both knees 05/06/2018   Past Surgical History:  Procedure Laterality Date   CHOLECYSTECTOMY  2004   WISDOM TOOTH EXTRACTION      Family History  Problem Relation Age of Onset   Cancer Mother        Tumor in knee   Arrhythmia Father    Pancreatic cancer Paternal Grandfather    Hypertension Paternal Grandfather    Colon cancer Other        Great Grandfather   Colon polyps Neg Hx    Esophageal cancer Neg Hx    Rectal cancer Neg Hx    Stomach cancer Neg Hx     Medications- reviewed and updated Current Outpatient Medications  Medication Sig Dispense Refill   AMBULATORY NON FORMULARY MEDICATION Diltiazem gel with 2% lidocaine  Apply a pea sized amount into your rectum three times daily as needed Dispense 30 GM zero  refill 30 g 0   hydrocortisone  (ANUSOL -HC) 25 MG suppository Place 1 suppository (25 mg total) rectally 2 (two) times daily. 12 suppository 0   ketoconazole  (NIZORAL ) 2 % cream Apply 1 Application topically 2 (two) times daily. 60 g 5   tadalafil  (CIALIS ) 20 MG tablet Take 0.5-1 tablets (10-20 mg total) by mouth every other day as needed for erectile dysfunction. 30 tablet 11   valACYclovir  (VALTREX ) 500 MG tablet Take 1 tablet (500 mg total) by mouth 2 (two) times daily. for 3 days as needed for outbreaks 30 tablet 3   No current facility-administered medications for this visit.    Allergies-reviewed and updated Allergies  Allergen Reactions   Penicillins Swelling    Social History   Socioeconomic History   Marital status: Married    Spouse name: Not on file   Number of children: Not on file   Years of education: Not on file   Highest education level: Master's degree (e.g., MA, MS, MEng, MEd, MSW, MBA)  Occupational History   Not on file  Tobacco Use   Smoking status:  Former   Smokeless tobacco: Never   Tobacco comments:    Quit in 2002  Vaping Use   Vaping status: Never Used  Substance and Sexual Activity   Alcohol use: Yes    Alcohol/week: 4.0 standard drinks of alcohol    Types: 4 Standard drinks or equivalent per week    Comment: less than 2 drinks daily   Drug use: Never   Sexual activity: Yes  Other Topics Concern   Not on file  Social History Narrative   Not on file   Social Drivers of Health   Financial Resource Strain: Low Risk  (04/10/2024)   Overall Financial Resource Strain (CARDIA)    Difficulty of Paying Living Expenses: Not very hard  Food Insecurity: No Food Insecurity (04/10/2024)   Hunger Vital Sign    Worried About Running Out of Food in the Last Year: Never true    Ran Out of Food in the Last Year: Never true  Transportation Needs: No Transportation Needs (04/10/2024)   PRAPARE - Administrator, Civil Service (Medical): No     Lack of Transportation (Non-Medical): No  Physical Activity: Sufficiently Active (04/10/2024)   Exercise Vital Sign    Days of Exercise per Week: 4 days    Minutes of Exercise per Session: 60 min  Stress: Stress Concern Present (04/10/2024)   [employer] of Occupational Health - Occupational Stress Questionnaire    Feeling of Stress: To some extent  Social Connections: Socially Integrated (04/10/2024)   Social Connection and Isolation Panel    Frequency of Communication with Friends and Family: Once a week    Frequency of Social Gatherings with Friends and Family: Twice a week    Attends Religious Services: More than 4 times per year    Active Member of [employer] or Organizations: Yes    Attends Banker Meetings: 1 to 4 times per year    Marital Status: Married        Objective:  Physical Exam: BP 110/78   Pulse (!) 53   Temp (!) 97.2 F (36.2 C) (Temporal)   Ht 6' 1 (1.854 m)   Wt 210 lb 3.2 oz (95.3 kg)   SpO2 98%   BMI 27.73 kg/m   Body mass index is 27.73 kg/m. Wt Readings from Last 3 Encounters:  04/14/24 210 lb 3.2 oz (95.3 kg)  10/12/22 203 lb (92.1 kg)  04/20/22 199 lb 8 oz (90.5 kg)   Gen: NAD, resting comfortably HEENT: TMs normal bilaterally. OP clear. No thyromegaly noted.  CV: RRR with no murmurs appreciated Pulm: NWOB, CTAB with no crackles, wheezes, or rhonchi GI: Normal bowel sounds present. Soft, Nontender, Nondistended. MSK: no edema, cyanosis, or clubbing noted.  Hemosiderin deposits noted bilateral medial shin.  Several scattered varicosities noted in lower extremities as well. Skin: warm, dry.  Benign-appearing nevi on back Neuro: CN2-12 grossly intact. Strength 5/5 in upper and lower extremities. Reflexes symmetric and intact bilaterally.  Psych: Normal affect and thought content     Kaydin Karbowski M. Kennyth, MD 04/14/2024 7:55 AM

## 2024-04-14 NOTE — Assessment & Plan Note (Signed)
 Patient with varicosities noted in bilateral lower extremities.  Has a few scattered hemosiderin deposits as well.  Reassured patient this is a cosmetic issue.  Not currently having any other symptoms.  We did discuss use of compression stockings and leg elevation.  He will let us  know if he needs referral to see dermatology.

## 2024-04-14 NOTE — Patient Instructions (Addendum)
 It was very nice to see you today!  VISIT SUMMARY: Today, you had your annual physical exam and received a tetanus shot. We discussed several health concerns including erectile dysfunction, sleep issues, knee pain, and skin lesions. Blood work was ordered to check your testosterone and magnesium levels.  YOUR PLAN: ERECTILE DYSFUNCTION: You have been experiencing decreased efficacy with your current dose of Cialis . -Increase Cialis  dose to 20 mg. Be aware of potential side effects like headache and dizziness. Report on efficacy and any side effects.  INSOMNIA: You have trouble falling asleep and wake up around 2 or 3 AM. -Follow sleep hygiene tips provided. -You may use diphenhydramine occasionally as a sleep aid. -We will check your serum magnesium levels.  Please try to incorporate the following into your daily routine:  1. Sleep only long enough to feel rested and then get out of bed  2. Go to bed and get up at the same time every day  3. Do not try to force yourself to sleep. If you can't sleep, get out of bed and try again later.  4. Have coffee, tea, and other foods that have caffeine only in the morning  5. Avoid alcohol in the late afternoon, evening, and bedtime  6. Avoid smoking, especially in the evening  7. Keep your bedroom dark, cool, quiet, and free of reminders of work or other things that cause you stress  8. Solve problems you have before you go to bed  9. Exercise several days a week, but not right before bed  10. Avoid looking at phones or reading devices (e-books) that give off light before bed. This can make it harder to fall asleep.   KNEE PAIN: You have chronic knee pain managed with weight-bearing exercises and occasional use of ibuprofen or Tylenol. -Use ice and compression sleeves for pain management. -Monitor NSAID use and seek further evaluation if pain persists or if you need to use NSAIDs more than a few times per week.  HEMOSIDERIN DEPOSITS:  You have marks on your shins likely due to previous trauma, with varicose veins possibly contributing. -Consider using cortisone cream for cosmetic improvement if desired.  VARICOSE VEINS: You have hereditary varicose veins that are not currently bothersome. -Consider using compression stockings if needed.  GENERAL HEALTH MAINTENANCE: Routine physical examination and health maintenance. -Administered tetanus vaccine. -Ordered blood work including testosterone and magnesium levels. -Encourage a balanced diet with reduced sugar intake. -Encourage regular exercise including cardio and weightlifting.  Return in about 1 year (around 04/14/2025) for Annual Physical.   Take care, Dr Kennyth  PLEASE NOTE:  If you had any lab tests, please let us  know if you have not heard back within a few days. You may see your results on mychart before we have a chance to review them but we will give you a call once they are reviewed by us .   If we ordered any referrals today, please let us  know if you have not heard from their office within the next week.   If you had any urgent prescriptions sent in today, please check with the pharmacy within an hour of our visit to make sure the prescription was transmitted appropriately.   Please try these tips to maintain a healthy lifestyle:  Eat at least 3 REAL meals and 1-2 snacks per day.  Aim for no more than 5 hours between eating.  If you eat breakfast, please do so within one hour of getting up.   Each meal should  contain half fruits/vegetables, one quarter protein, and one quarter carbs (no bigger than a computer mouse)  Cut down on sweet beverages. This includes juice, soda, and sweet tea.   Drink at least 1 glass of water with each meal and aim for at least 8 glasses per day  Exercise at least 150 minutes every week.    Preventive Care 39-62 Years Old, Male Preventive care refers to lifestyle choices and visits with your health care provider that can  promote health and wellness. Preventive care visits are also called wellness exams. What can I expect for my preventive care visit? Counseling During your preventive care visit, your health care provider may ask about your: Medical history, including: Past medical problems. Family medical history. Current health, including: Emotional well-being. Home life and relationship well-being. Sexual activity. Lifestyle, including: Alcohol, nicotine or tobacco, and drug use. Access to firearms. Diet, exercise, and sleep habits. Safety issues such as seatbelt and bike helmet use. Sunscreen use. Work and work astronomer. Physical exam Your health care provider will check your: Height and weight. These may be used to calculate your BMI (body mass index). BMI is a measurement that tells if you are at a healthy weight. Waist circumference. This measures the distance around your waistline. This measurement also tells if you are at a healthy weight and may help predict your risk of certain diseases, such as type 2 diabetes and high blood pressure. Heart rate and blood pressure. Body temperature. Skin for abnormal spots. What immunizations do I need?  Vaccines are usually given at various ages, according to a schedule. Your health care provider will recommend vaccines for you based on your age, medical history, and lifestyle or other factors, such as travel or where you work. What tests do I need? Screening Your health care provider may recommend screening tests for certain conditions. This may include: Lipid and cholesterol levels. Diabetes screening. This is done by checking your blood sugar (glucose) after you have not eaten for a while (fasting). Hepatitis B test. Hepatitis C test. HIV (human immunodeficiency virus) test. STI (sexually transmitted infection) testing, if you are at risk. Lung cancer screening. Prostate cancer screening. Colorectal cancer screening. Talk with your health  care provider about your test results, treatment options, and if necessary, the need for more tests. Follow these instructions at home: Eating and drinking  Eat a diet that includes fresh fruits and vegetables, whole grains, lean protein, and low-fat dairy products. Take vitamin and mineral supplements as recommended by your health care provider. Do not drink alcohol if your health care provider tells you not to drink. If you drink alcohol: Limit how much you have to 0-2 drinks a day. Know how much alcohol is in your drink. In the [place] one drink equals one 12 oz bottle of beer (355 mL), one 5 oz glass of wine (148 mL), or one 1 oz glass of hard liquor (44 mL). Lifestyle Brush your teeth every morning and night with fluoride toothpaste. Floss one time each day. Exercise for at least 30 minutes 5 or more days each week. Do not use any products that contain nicotine or tobacco. These products include cigarettes, chewing tobacco, and vaping devices, such as e-cigarettes. If you need help quitting, ask your health care provider. Do not use drugs. If you are sexually active, practice safe sex. Use a condom or other form of protection to prevent STIs. Take aspirin only as told by your health care provider. Make sure that you understand how  much to take and what form to take. Work with your health care provider to find out whether it is safe and beneficial for you to take aspirin daily. Find healthy ways to manage stress, such as: Meditation, yoga, or listening to music. Journaling. Talking to a trusted person. Spending time with friends and family. Minimize exposure to UV radiation to reduce your risk of skin cancer. Safety Always wear your seat belt while driving or riding in a vehicle. Do not drive: If you have been drinking alcohol. Do not ride with someone who has been drinking. When you are tired or distracted. While texting. If you have been using any mind-altering substances or  drugs. Wear a helmet and other protective equipment during sports activities. If you have firearms in your house, make sure you follow all gun safety procedures. What's next? Go to your health care provider once a year for an annual wellness visit. Ask your health care provider how often you should have your eyes and teeth checked. Stay up to date on all vaccines. This information is not intended to replace advice given to you by your health care provider. Make sure you discuss any questions you have with your health care provider. Document Revised: 11/30/2020 Document Reviewed: 11/30/2020 Elsevier Patient Education  2024 [employer].

## 2024-04-14 NOTE — Assessment & Plan Note (Signed)
 Check labs today.  Discussed sleep hygiene measures.  He is not interested in medications for this at this point.  Can continue using melatonin as needed.

## 2024-04-14 NOTE — Assessment & Plan Note (Signed)
 Likely underlying osteoarthritis.  We discussed conservative management including occasional ice, compression, and over-the-counter analgesics such as ibuprofen and Tylenol as needed.  He will let us  know if symptoms are not controlled with this and would consider imaging versus referral to sports medicine at that time.

## 2024-04-14 NOTE — Assessment & Plan Note (Addendum)
 On Cialis  5 to 10 mg daily as needed though thinks it could be a little bit stronger.  Will increase to 20 mg daily.  Discussed side effects.  Check testosterone labs.  He will follow-up with us  in a few weeks on MyChart.

## 2024-04-14 NOTE — Assessment & Plan Note (Signed)
 Check lipids. Discussed lifestyle modifications.

## 2024-04-15 ENCOUNTER — Telehealth: Payer: Self-pay | Admitting: *Deleted

## 2024-04-15 ENCOUNTER — Other Ambulatory Visit: Payer: Self-pay | Admitting: Family Medicine

## 2024-04-15 ENCOUNTER — Ambulatory Visit: Payer: Self-pay | Admitting: Family Medicine

## 2024-04-15 MED ORDER — VITAMIN D (ERGOCALCIFEROL) 1.25 MG (50000 UNIT) PO CAPS: 50000.0000 [IU] | ORAL_CAPSULE | ORAL | 0 refills | Status: AC

## 2024-04-15 NOTE — Telephone Encounter (Signed)
 Copied from CRM [id number]. Topic: Clinical - Lab/Test Results >> Apr 15, 2024  1:40 PM Emylou G wrote: Reason for CRM: Adv patient of lab results.. wants more details b12 - pls clal him   See result note  Tanice Petre,RMA

## 2024-04-15 NOTE — Progress Notes (Signed)
 This B12 is low.  This may be contributing to his symptoms.  Recommend he start B12 protocol here.  Vitamin D is also low.  Recommend starting vitamin D 10,000 IUs weekly.  We should recheck this in 3 months.  His cholesterol is up a little bit since last time.  Does not need to start meds for this but is very important that he focus on diet and exercise and we can recheck this again in a year or so.  All of his other labs are stable.  We can recheck everything else in a year.

## 2024-04-16 ENCOUNTER — Ambulatory Visit (INDEPENDENT_AMBULATORY_CARE_PROVIDER_SITE_OTHER): Admitting: *Deleted

## 2024-04-16 DIAGNOSIS — E538 Deficiency of other specified B group vitamins: Secondary | ICD-10-CM

## 2024-04-16 MED ORDER — CYANOCOBALAMIN 1000 MCG/ML IJ SOLN
1000.0000 ug | Freq: Once | INTRAMUSCULAR | Status: AC
  Administered 2024-04-16: 1000 ug via INTRAMUSCULAR

## 2024-04-16 NOTE — Progress Notes (Signed)
Patient presents for B12 injection today. Patient received her B12 injection in left Deltoid. Patient tolerated injection well.  Documentation entered in MAR in EpicCare.   

## 2024-04-23 ENCOUNTER — Ambulatory Visit

## 2024-04-23 DIAGNOSIS — E538 Deficiency of other specified B group vitamins: Secondary | ICD-10-CM

## 2024-04-23 MED ORDER — CYANOCOBALAMIN 1000 MCG/ML IJ SOLN
1000.0000 ug | Freq: Once | INTRAMUSCULAR | Status: AC
  Administered 2024-04-23: 1000 ug via INTRAMUSCULAR

## 2024-04-23 NOTE — Progress Notes (Signed)
 Patient is in office today for a nurse visit for B12 Injection. Patient Injection was given in the  Left deltoid. Patient tolerated injection well. No other questions or concerns were addressed during this visit.

## 2024-04-23 NOTE — Addendum Note (Signed)
 Addended by: FRANCIS ROULEAU A on: 04/23/2024 11:06 AM   Modules accepted: Orders

## 2024-04-30 ENCOUNTER — Ambulatory Visit

## 2024-04-30 DIAGNOSIS — E538 Deficiency of other specified B group vitamins: Secondary | ICD-10-CM | POA: Diagnosis not present

## 2024-04-30 MED ORDER — CYANOCOBALAMIN 1000 MCG/ML IJ SOLN
1000.0000 ug | Freq: Once | INTRAMUSCULAR | Status: AC
  Administered 2024-04-30: 1000 ug via INTRAMUSCULAR

## 2024-04-30 NOTE — Progress Notes (Signed)
 Patient is in office today for a nurse visit for B12 Injection. Patient Injection was given in the  Left deltoid. Patient tolerated injection well. No other questions or concerns were addressed during the visit.

## 2024-05-07 ENCOUNTER — Ambulatory Visit (INDEPENDENT_AMBULATORY_CARE_PROVIDER_SITE_OTHER)

## 2024-05-07 DIAGNOSIS — E538 Deficiency of other specified B group vitamins: Secondary | ICD-10-CM

## 2024-05-07 MED ORDER — CYANOCOBALAMIN 1000 MCG/ML IJ SOLN
1000.0000 ug | Freq: Once | INTRAMUSCULAR | Status: DC
  Administered 2024-05-07: 1000 ug via INTRAMUSCULAR

## 2024-05-07 MED ORDER — CYANOCOBALAMIN 1000 MCG/ML IJ SOLN: 1000.0000 ug | Freq: Once | INTRAMUSCULAR | Status: AC

## 2024-05-07 NOTE — Addendum Note (Signed)
 Addended by: CRISTOPHER TAWNI BROCKS on: 05/07/2024 09:58 AM   Modules accepted: Orders

## 2024-05-07 NOTE — Progress Notes (Signed)
 Patient is in office today for a nurse visit for B12 Injection. Patient Injection was given in the  Right deltoid. Patient tolerated injection well.

## 2024-05-07 NOTE — Addendum Note (Signed)
 Addended by: CRISTOPHER MAUS C on: 05/07/2024 10:00 AM   Modules accepted: Orders

## 2024-06-04 ENCOUNTER — Ambulatory Visit

## 2024-06-04 ENCOUNTER — Encounter: Payer: Self-pay | Admitting: Family Medicine

## 2024-06-04 DIAGNOSIS — E538 Deficiency of other specified B group vitamins: Secondary | ICD-10-CM | POA: Diagnosis not present

## 2024-06-04 MED ADMIN — Cyanocobalamin Inj 1000 MCG/ML: 1000 ug | INTRAMUSCULAR | @ 11:00:00 | NDC 72603065001

## 2024-06-04 NOTE — Telephone Encounter (Signed)
 Left message to return call to our office at their convenience.

## 2024-06-04 NOTE — Progress Notes (Signed)
 Patient is in office today for a nurse visit for B12 Injection. Patient Injection was given in the  Left deltoid. Patient tolerated injection well.

## 2024-06-10 NOTE — Telephone Encounter (Unsigned)
 Copied from CRM #[id number]. Topic: Clinical - Medication Question >> Jun 04, 2024  4:28 PM Dedra B wrote: Reason for CRM: Pt returning call for [facility] regarding B12. Pt would prefer a reply via MyChart.

## 2024-06-25 ENCOUNTER — Encounter: Admitting: Family Medicine

## 2024-06-26 ENCOUNTER — Encounter: Admitting: Family Medicine

## 2024-07-08 ENCOUNTER — Ambulatory Visit

## 2024-07-08 DIAGNOSIS — E538 Deficiency of other specified B group vitamins: Secondary | ICD-10-CM | POA: Diagnosis not present

## 2024-07-08 MED ORDER — CYANOCOBALAMIN 1000 MCG/ML IJ SOLN
1000.0000 ug | Freq: Once | INTRAMUSCULAR | Status: AC
  Administered 2024-07-08: 1000 ug via INTRAMUSCULAR

## 2024-07-08 NOTE — Progress Notes (Signed)
 JPatient is in office today for a nurse visit for B12 Injection. Patient Injection was given in the  Left deltoid. Patient tolerated injection well.

## 2024-08-12 ENCOUNTER — Ambulatory Visit

## 2025-04-16 ENCOUNTER — Encounter: Admitting: Family Medicine
